# Patient Record
Sex: Female | Born: 1965 | Race: Black or African American | Hispanic: No | Smoking: Current every day smoker
Health system: Southern US, Community
[De-identification: ages and names within clinical notes are randomized; demographics above are authoritative.]

## PROBLEM LIST (undated history)

## (undated) ENCOUNTER — Emergency Department (HOSPITAL_COMMUNITY): Discharge status: Absent without leave | Payer: Medicaid Other

## (undated) DIAGNOSIS — M1990 Unspecified osteoarthritis, unspecified site: Secondary | ICD-10-CM

## (undated) DIAGNOSIS — F319 Bipolar disorder, unspecified: Secondary | ICD-10-CM

## (undated) DIAGNOSIS — N6320 Unspecified lump in the left breast, unspecified quadrant: Secondary | ICD-10-CM

## (undated) DIAGNOSIS — Z972 Presence of dental prosthetic device (complete) (partial): Secondary | ICD-10-CM

## (undated) DIAGNOSIS — F32A Depression, unspecified: Secondary | ICD-10-CM

## (undated) DIAGNOSIS — I10 Essential (primary) hypertension: Secondary | ICD-10-CM

## (undated) DIAGNOSIS — J45909 Unspecified asthma, uncomplicated: Secondary | ICD-10-CM

## (undated) DIAGNOSIS — K219 Gastro-esophageal reflux disease without esophagitis: Secondary | ICD-10-CM

## (undated) DIAGNOSIS — F329 Major depressive disorder, single episode, unspecified: Secondary | ICD-10-CM

## (undated) HISTORY — DX: Major depressive disorder, single episode, unspecified: F32.9

## (undated) HISTORY — DX: Depression, unspecified: F32.A

## (undated) HISTORY — PX: KNEE SURGERY: SHX244

## (undated) HISTORY — DX: Essential (primary) hypertension: I10

## (undated) HISTORY — PX: BREAST EXCISIONAL BIOPSY: SUR124

## (undated) HISTORY — PX: KNEE ARTHROSCOPY: SHX127

---

## 1999-11-25 ENCOUNTER — Emergency Department (HOSPITAL_COMMUNITY): Admission: EM | Admit: 1999-11-25 | Discharge: 1999-11-25 | Payer: Self-pay | Admitting: Emergency Medicine

## 2000-09-25 ENCOUNTER — Emergency Department (HOSPITAL_COMMUNITY): Admission: EM | Admit: 2000-09-25 | Discharge: 2000-09-25 | Payer: Self-pay | Admitting: Emergency Medicine

## 2001-06-15 ENCOUNTER — Emergency Department (HOSPITAL_COMMUNITY): Admission: EM | Admit: 2001-06-15 | Discharge: 2001-06-15 | Payer: Self-pay | Admitting: Emergency Medicine

## 2002-03-01 ENCOUNTER — Emergency Department (HOSPITAL_COMMUNITY): Admission: EM | Admit: 2002-03-01 | Discharge: 2002-03-01 | Payer: Self-pay | Admitting: Emergency Medicine

## 2003-08-16 ENCOUNTER — Emergency Department (HOSPITAL_COMMUNITY): Admission: EM | Admit: 2003-08-16 | Discharge: 2003-08-16 | Payer: Self-pay | Admitting: Emergency Medicine

## 2003-08-31 ENCOUNTER — Emergency Department (HOSPITAL_COMMUNITY): Admission: EM | Admit: 2003-08-31 | Discharge: 2003-08-31 | Payer: Self-pay

## 2003-09-13 ENCOUNTER — Ambulatory Visit: Payer: Self-pay | Admitting: Nurse Practitioner

## 2003-09-14 ENCOUNTER — Ambulatory Visit: Payer: Self-pay | Admitting: Internal Medicine

## 2003-09-18 ENCOUNTER — Ambulatory Visit: Payer: Self-pay | Admitting: Nurse Practitioner

## 2003-09-19 ENCOUNTER — Emergency Department (HOSPITAL_COMMUNITY): Admission: EM | Admit: 2003-09-19 | Discharge: 2003-09-19 | Payer: Self-pay

## 2003-10-15 ENCOUNTER — Ambulatory Visit: Payer: Self-pay | Admitting: *Deleted

## 2003-12-24 ENCOUNTER — Emergency Department (HOSPITAL_COMMUNITY): Admission: EM | Admit: 2003-12-24 | Discharge: 2003-12-24 | Payer: Self-pay | Admitting: Emergency Medicine

## 2006-07-23 ENCOUNTER — Ambulatory Visit: Payer: Self-pay | Admitting: Internal Medicine

## 2006-07-26 ENCOUNTER — Ambulatory Visit (HOSPITAL_COMMUNITY): Admission: RE | Admit: 2006-07-26 | Discharge: 2006-07-26 | Payer: Self-pay | Admitting: Family Medicine

## 2006-09-29 ENCOUNTER — Encounter (INDEPENDENT_AMBULATORY_CARE_PROVIDER_SITE_OTHER): Payer: Self-pay | Admitting: *Deleted

## 2006-12-29 ENCOUNTER — Emergency Department (HOSPITAL_COMMUNITY): Admission: EM | Admit: 2006-12-29 | Discharge: 2006-12-29 | Payer: Self-pay | Admitting: Emergency Medicine

## 2007-03-21 ENCOUNTER — Emergency Department (HOSPITAL_COMMUNITY): Admission: EM | Admit: 2007-03-21 | Discharge: 2007-03-21 | Payer: Self-pay | Admitting: Emergency Medicine

## 2007-05-18 ENCOUNTER — Ambulatory Visit: Payer: Self-pay | Admitting: Internal Medicine

## 2007-05-18 ENCOUNTER — Encounter (INDEPENDENT_AMBULATORY_CARE_PROVIDER_SITE_OTHER): Payer: Self-pay | Admitting: Nurse Practitioner

## 2007-05-18 LAB — CONVERTED CEMR LAB
ALT: 20 units/L (ref 0–35)
AST: 21 units/L (ref 0–37)
Albumin: 4.2 g/dL (ref 3.5–5.2)
Alkaline Phosphatase: 62 units/L (ref 39–117)
BUN: 8 mg/dL (ref 6–23)
Basophils Absolute: 0 10*3/uL (ref 0.0–0.1)
Basophils Relative: 0 % (ref 0–1)
CO2: 24 meq/L (ref 19–32)
Calcium: 9.1 mg/dL (ref 8.4–10.5)
Chloride: 105 meq/L (ref 96–112)
Creatinine, Ser: 0.67 mg/dL (ref 0.40–1.20)
Eosinophils Absolute: 0.2 10*3/uL (ref 0.0–0.7)
Eosinophils Relative: 3 % (ref 0–5)
Glucose, Bld: 106 mg/dL — ABNORMAL HIGH (ref 70–99)
HCT: 44.2 % (ref 36.0–46.0)
Hemoglobin: 13.9 g/dL (ref 12.0–15.0)
Lymphocytes Relative: 41 % (ref 12–46)
Lymphs Abs: 3.6 10*3/uL (ref 0.7–4.0)
MCHC: 31.4 g/dL (ref 30.0–36.0)
MCV: 82 fL (ref 78.0–100.0)
Monocytes Absolute: 0.5 10*3/uL (ref 0.1–1.0)
Monocytes Relative: 6 % (ref 3–12)
Neutro Abs: 4.4 10*3/uL (ref 1.7–7.7)
Neutrophils Relative %: 50 % (ref 43–77)
Platelets: 289 10*3/uL (ref 150–400)
Potassium: 4.2 meq/L (ref 3.5–5.3)
RBC: 5.39 M/uL — ABNORMAL HIGH (ref 3.87–5.11)
RDW: 16.7 % — ABNORMAL HIGH (ref 11.5–15.5)
Sodium: 140 meq/L (ref 135–145)
TSH: 1.06 microintl units/mL (ref 0.350–5.50)
Total Bilirubin: 0.4 mg/dL (ref 0.3–1.2)
Total Protein: 7.1 g/dL (ref 6.0–8.3)
WBC: 8.8 10*3/uL (ref 4.0–10.5)

## 2008-07-12 ENCOUNTER — Ambulatory Visit: Payer: Self-pay | Admitting: Internal Medicine

## 2008-08-22 ENCOUNTER — Ambulatory Visit: Payer: Self-pay | Admitting: Family Medicine

## 2008-08-22 ENCOUNTER — Encounter (INDEPENDENT_AMBULATORY_CARE_PROVIDER_SITE_OTHER): Payer: Self-pay | Admitting: Internal Medicine

## 2008-08-22 LAB — CONVERTED CEMR LAB
ALT: <8 units/L (ref 0–35)
AST: 8 units/L (ref 0–37)
Albumin: 4.4 g/dL (ref 3.5–5.2)
Alkaline Phosphatase: 53 units/L (ref 39–117)
BUN: 12 mg/dL (ref 6–23)
Basophils Absolute: 0 10*3/uL (ref 0.0–0.1)
Basophils Relative: 0 % (ref 0–1)
CO2: 22 meq/L (ref 19–32)
Calcium: 9.3 mg/dL (ref 8.4–10.5)
Chlamydia, Swab/Urine, PCR: NEGATIVE
Chloride: 108 meq/L (ref 96–112)
Creatinine, Ser: 0.68 mg/dL (ref 0.40–1.20)
Eosinophils Absolute: 0.1 10*3/uL (ref 0.0–0.7)
Eosinophils Relative: 1 % (ref 0–5)
GC Probe Amp, Urine: NEGATIVE
Glucose, Bld: 97 mg/dL (ref 70–99)
HCT: 39.7 % (ref 36.0–46.0)
Hemoglobin: 12.8 g/dL (ref 12.0–15.0)
Lymphocytes Relative: 35 % (ref 12–46)
Lymphs Abs: 3.2 10*3/uL (ref 0.7–4.0)
MCHC: 32.2 g/dL (ref 30.0–36.0)
MCV: 82.7 fL (ref 78.0–100.0)
Monocytes Absolute: 0.5 10*3/uL (ref 0.1–1.0)
Monocytes Relative: 5 % (ref 3–12)
Neutro Abs: 5.4 10*3/uL (ref 1.7–7.7)
Neutrophils Relative %: 58 % (ref 43–77)
Platelets: 258 10*3/uL (ref 150–400)
Potassium: 4.1 meq/L (ref 3.5–5.3)
RBC: 4.8 M/uL (ref 3.87–5.11)
RDW: 15.1 % (ref 11.5–15.5)
Sodium: 142 meq/L (ref 135–145)
TSH: 1.479 microintl units/mL (ref 0.350–4.500)
Total Bilirubin: 0.2 mg/dL — ABNORMAL LOW (ref 0.3–1.2)
Total Protein: 7.1 g/dL (ref 6.0–8.3)
WBC: 9.2 10*3/uL (ref 4.0–10.5)

## 2009-07-08 ENCOUNTER — Emergency Department (HOSPITAL_COMMUNITY): Admission: EM | Admit: 2009-07-08 | Discharge: 2009-07-08 | Payer: Self-pay | Admitting: Emergency Medicine

## 2009-07-08 ENCOUNTER — Ambulatory Visit (HOSPITAL_COMMUNITY): Admission: RE | Admit: 2009-07-08 | Discharge: 2009-07-08 | Payer: Self-pay | Admitting: Psychiatry

## 2009-07-25 ENCOUNTER — Ambulatory Visit: Payer: Self-pay | Admitting: Internal Medicine

## 2009-08-06 ENCOUNTER — Emergency Department (HOSPITAL_COMMUNITY): Admission: EM | Admit: 2009-08-06 | Discharge: 2009-08-06 | Payer: Self-pay | Admitting: Family Medicine

## 2009-11-14 ENCOUNTER — Emergency Department (HOSPITAL_COMMUNITY): Admission: EM | Admit: 2009-11-14 | Discharge: 2009-11-14 | Payer: Self-pay | Admitting: Emergency Medicine

## 2009-12-21 ENCOUNTER — Emergency Department (HOSPITAL_COMMUNITY)
Admission: EM | Admit: 2009-12-21 | Discharge: 2009-12-21 | Payer: Self-pay | Source: Home / Self Care | Admitting: Family Medicine

## 2009-12-31 ENCOUNTER — Emergency Department (HOSPITAL_COMMUNITY)
Admission: EM | Admit: 2009-12-31 | Discharge: 2009-12-31 | Payer: Self-pay | Source: Home / Self Care | Admitting: Emergency Medicine

## 2010-02-02 ENCOUNTER — Encounter: Payer: Self-pay | Admitting: Family Medicine

## 2010-02-03 ENCOUNTER — Encounter: Payer: Self-pay | Admitting: Internal Medicine

## 2010-02-06 ENCOUNTER — Other Ambulatory Visit: Payer: Self-pay | Admitting: Internal Medicine

## 2010-02-06 DIAGNOSIS — Z1239 Encounter for other screening for malignant neoplasm of breast: Secondary | ICD-10-CM

## 2010-02-13 ENCOUNTER — Ambulatory Visit: Payer: Self-pay

## 2010-02-18 ENCOUNTER — Other Ambulatory Visit: Payer: Self-pay | Admitting: Internal Medicine

## 2010-02-18 DIAGNOSIS — Z1231 Encounter for screening mammogram for malignant neoplasm of breast: Secondary | ICD-10-CM

## 2010-02-26 ENCOUNTER — Ambulatory Visit: Payer: Self-pay | Admitting: Obstetrics & Gynecology

## 2010-02-27 ENCOUNTER — Ambulatory Visit: Payer: Self-pay

## 2010-03-03 ENCOUNTER — Ambulatory Visit: Payer: Self-pay

## 2010-03-05 ENCOUNTER — Other Ambulatory Visit: Payer: Self-pay | Admitting: Obstetrics and Gynecology

## 2010-03-05 ENCOUNTER — Ambulatory Visit (INDEPENDENT_AMBULATORY_CARE_PROVIDER_SITE_OTHER): Payer: Medicaid Other | Admitting: Obstetrics and Gynecology

## 2010-03-05 DIAGNOSIS — N946 Dysmenorrhea, unspecified: Secondary | ICD-10-CM

## 2010-03-05 DIAGNOSIS — D219 Benign neoplasm of connective and other soft tissue, unspecified: Secondary | ICD-10-CM

## 2010-03-11 ENCOUNTER — Ambulatory Visit (HOSPITAL_COMMUNITY)
Admission: RE | Admit: 2010-03-11 | Discharge: 2010-03-11 | Disposition: A | Discharge status: Home / Self Care | Payer: Medicaid Other | Source: Ambulatory Visit | Attending: Obstetrics and Gynecology | Admitting: Obstetrics and Gynecology

## 2010-03-11 DIAGNOSIS — D259 Leiomyoma of uterus, unspecified: Secondary | ICD-10-CM | POA: Insufficient documentation

## 2010-03-11 DIAGNOSIS — N949 Unspecified condition associated with female genital organs and menstrual cycle: Secondary | ICD-10-CM | POA: Insufficient documentation

## 2010-03-11 DIAGNOSIS — D219 Benign neoplasm of connective and other soft tissue, unspecified: Secondary | ICD-10-CM

## 2010-03-19 ENCOUNTER — Other Ambulatory Visit: Payer: Self-pay | Admitting: Obstetrics and Gynecology

## 2010-03-19 ENCOUNTER — Ambulatory Visit (INDEPENDENT_AMBULATORY_CARE_PROVIDER_SITE_OTHER): Payer: Medicaid Other | Admitting: Obstetrics and Gynecology

## 2010-03-19 DIAGNOSIS — N949 Unspecified condition associated with female genital organs and menstrual cycle: Secondary | ICD-10-CM

## 2010-03-21 ENCOUNTER — Ambulatory Visit: Payer: Medicaid Other

## 2010-03-30 LAB — CBC
HCT: 42.3 % (ref 36.0–46.0)
Hemoglobin: 13.9 g/dL (ref 12.0–15.0)
MCH: 27.8 pg (ref 26.0–34.0)
MCHC: 33 g/dL (ref 30.0–36.0)
MCV: 84.3 fL (ref 78.0–100.0)
Platelets: 252 10*3/uL (ref 150–400)
RBC: 5.01 MIL/uL (ref 3.87–5.11)
RDW: 14.9 % (ref 11.5–15.5)
WBC: 8.4 10*3/uL (ref 4.0–10.5)

## 2010-03-30 LAB — DIFFERENTIAL
Basophils Absolute: 0 10*3/uL (ref 0.0–0.1)
Basophils Relative: 0 % (ref 0–1)
Eosinophils Absolute: 0.1 10*3/uL (ref 0.0–0.7)
Eosinophils Relative: 1 % (ref 0–5)
Lymphocytes Relative: 26 % (ref 12–46)
Lymphs Abs: 2.1 10*3/uL (ref 0.7–4.0)
Monocytes Absolute: 0.5 10*3/uL (ref 0.1–1.0)
Monocytes Relative: 6 % (ref 3–12)
Neutro Abs: 5.6 10*3/uL (ref 1.7–7.7)
Neutrophils Relative %: 67 % (ref 43–77)

## 2010-03-30 LAB — BASIC METABOLIC PANEL
BUN: 4 mg/dL — ABNORMAL LOW (ref 6–23)
CO2: 23 mEq/L (ref 19–32)
Calcium: 8.7 mg/dL (ref 8.4–10.5)
Chloride: 105 mEq/L (ref 96–112)
Creatinine, Ser: 0.75 mg/dL (ref 0.4–1.2)
GFR calc Af Amer: >60 mL/min (ref >60)
GFR calc non Af Amer: >60 mL/min (ref >60)
Glucose, Bld: 96 mg/dL (ref 70–99)
Potassium: 3.6 mEq/L (ref 3.5–5.1)
Sodium: 137 mEq/L (ref 135–145)

## 2010-03-30 LAB — PREGNANCY, URINE: Preg Test, Ur: NEGATIVE

## 2010-03-30 LAB — RAPID URINE DRUG SCREEN, HOSP PERFORMED
Amphetamines: NOT DETECTED
Barbiturates: NOT DETECTED
Benzodiazepines: NOT DETECTED
Cocaine: POSITIVE — AB
Opiates: NOT DETECTED
Tetrahydrocannabinol: NOT DETECTED

## 2010-03-30 LAB — TRICYCLICS SCREEN, URINE: TCA Scrn: NOT DETECTED

## 2010-03-30 LAB — ETHANOL: Alcohol, Ethyl (B): <5 mg/dL (ref 0–10)

## 2010-04-01 ENCOUNTER — Ambulatory Visit: Payer: Medicaid Other

## 2010-04-04 NOTE — Progress Notes (Signed)
NAME:  Monique Fowler, MILES NO.:  [id number]  MEDICAL RECORD NO.:  [id number]           PATIENT TYPE:  A  LOCATION:  [facility]                   FACILITY:  [facility]  PHYSICIAN:  Tinnie Gens, MD        DATE OF BIRTH:  September 26, 1965  DATE OF SERVICE:  03/05/2010                                 CLINIC NOTE  REASON FOR VISIT:  Dysmenorrhea.  HISTORY OF PRESENT ILLNESS:  Monique Fowler is a 45 year old G7, P7 whose last pregnancy was approximately 11 years ago coming in after being referred by [facility] for possible dysmenorrhea and possible need for endometrial biopsy.  The patient states that over the last 3 months, she has been having pain 3-4 days prior to her menstruation usually in the left side that radiates down into her pelvis, around her back, and even down her legs from-to-time.  The patient denies any type of increased bleeding or increased length in her menstruation at this time or increase in clot formation.  The patient denies any type of fevers, chills, nausea, vomiting, diarrhea, constipation, or weight changes on review of symptoms.  The patient for this pain has tried different things at this clinic, they had given her Vicodin, which is the only thing that seems to have helped the pain at this time.  PAST MEDICAL HISTORY:  The patient has a history of gastroesophageal reflux disease, bipolar disease, anxiety, hypertension, and insomnia.  PAST SURGICAL HISTORY:  The patient has had a C-section x1 as well as bilateral tubal ligation.  OB HISTORY:  The patient has been pregnant 7 times and has birth 7 children, 6 of them remain living.  SOCIAL HISTORY:  Smokes a quarter pack of cigarettes per day.  No alcohol.  No illicit.  FAMILY HISTORY:  There is liver cancer from her father, otherwise, unremarkable.  PHYSICAL EXAMINATION:  VITAL SIGNS:  Temperature 97.4, pulse 82, blood pressure 104/73, weight 216 pounds, and height 5 feet 7. GENERAL:  No  apparent distress.  Alert and oriented x3. HEART:  Regular rate and rhythm.  No murmur. LUNGS:  Clear to auscultation bilaterally.  Mild expiratory wheeze in the lower lobes bilaterally. ABDOMEN:  Bowel sounds positive.  The patient is tender to palpation in the left lower quadrant. PELVIC:  Normal external female genitalia.  Vaginal vault pink and moist, healthy looking, some mild yeast in the vaginal vault.  Cervix seen.  Pap taken.  Bimanual exam, the patient is tender on the left lower quadrant.  Uterus is mildly enlarged with approximately 12 weeks' gestation and possible fibroid palpated in the left lower quadrant. EXTREMITIES:  Trace edema.  ASSESSMENT AND PLAN:  Monique Fowler is a 45 year old female who is coming in with dysmenorrhea likely secondary to possible fibroid. 1. The patient likely has fibroid and dysmenorrhea.  The     patient will be scheduled for an ultrasound and then the patient is     to return after the ultrasound in approximately 2 weeks to go over     the results and possible treatment at that time.  The patient was     told about  the different types of oral contraceptives or other     types of contraceptives as well, then might be a good option for     the patient to decrease the frequency of her menstruation, which     would help with the pain.  The patient also told about possible     NSAID use for the pain instead of Vicodin which would help.  The     patient states it does upset her stomach, but she might give it a     try, has no history of any type of stomach ulcer. 2. Preventative care.  The patient did have the Pap smear done today.     The patient states has not had one for approximately 13 years.  We     will contact her if anything is abnormal.  The patient is already     scheduled for mammogram at this time.          ______________________________ Tinnie Gens, MD    TP/MEDQ  D:  03/05/2010  T:  03/06/2010  Job:  [id number]

## 2010-04-04 NOTE — Progress Notes (Unsigned)
NAME:  Monique Fowler, Monique Fowler NO.:  [id number]  MEDICAL RECORD NO.:  [id number]           PATIENT TYPE:  A  LOCATION:  [facility]                   FACILITY:  [facility]  PHYSICIAN:  Argentina Donovan, MD        DATE OF BIRTH:  10/13/1965  DATE OF SERVICE:  03/19/2010                                 CLINIC NOTE  The patient is a 45 year old gravida 7, para 7, whose last pregnancy was 11 years ago by cesarean section.  Since that time, she has had increasing dysmenorrhea, but the pain basically is starting as much as 2 weeks before her period and does not go away until after the period is over.  Periods have also gotten progressively heavier, although she has no bleeding in between.  She came in today.  She was seen a short time ago and got an ultrasound which showed a small fibroid on the uterus. Otherwise, the uterus looked fine.  We repeated the Pap smear today because there were no endocervical cells on the Pap on the previous one. She had been treated for Trichomonas, but did not take the medicine more than 1 day because it was making her sick.  I told her to take it with meals.  I gave her another prescription today.  What we are going to do is try her on Depo-Provera to see whether that is stopping her periods and if that stops the pain.  If not, I think that the only solution to this lady's discomfort which is becoming intolerable is a hysterectomy feeling that she has adenomyosis and in general I have not found the Depo-Provera was of much help.  IMPRESSION: 1. Inadequate Pap smear, repeated. 2. Trichomonal vaginitis. 3. Adenomyosis. 4. Dysmenorrhea. 5. Menorrhagia.          ______________________________ Argentina Donovan, MD    PR/MEDQ  D:  03/19/2010  T:  03/20/2010  Job:  [id number]

## 2010-05-12 ENCOUNTER — Ambulatory Visit (INDEPENDENT_AMBULATORY_CARE_PROVIDER_SITE_OTHER): Payer: Medicaid Other | Admitting: Obstetrics & Gynecology

## 2010-05-12 DIAGNOSIS — E669 Obesity, unspecified: Secondary | ICD-10-CM

## 2010-05-12 DIAGNOSIS — Z01818 Encounter for other preprocedural examination: Secondary | ICD-10-CM

## 2010-05-13 NOTE — Group Therapy Note (Signed)
NAME:  Monique Fowler, Monique Fowler NO.:  [id number]  MEDICAL RECORD NO.:  [id number]           PATIENT TYPE:  A  LOCATION:  [facility]                   FACILITY:  [facility]  PHYSICIAN:  Allie Bossier, MD        DATE OF BIRTH:  17-Aug-1965  DATE OF SERVICE:  05/12/2010                                 CLINIC NOTE  Ms. Seifer is a 45 year old single black P6, A1 who was seen here by Dr. Okey Dupre on March 19, 2010, with a complaint of increasing dysmenorrhea, that starts as much as 2 weeks before her period and then does not go away until her period is over.  She says that ibuprofen does not help and she has tried [facility] powders and they are getting not be helpful either.  Of note, she was given a prescription for Trichomonas in February 2012, however, she says she threw it and did not keep the medicine, so Dr. Okey Dupre gave her another prescription in March 2012.  She says she was able to keep it down but her boyfriend did not get it.  I have explained at length that should she have surgery (hysterectomy) and that she has been untreated or has gotten Trichomonas back from her boyfriend again, then she could certainly develop a wound/pelvic infection that could be very serious.  Workup Dr. Okey Dupre did include an ultrasound which showed a 3-cm fundal intramural fibroid.  Her uterus measures 8.8 x 4 x 4.5 cm and the ovaries appeared normal.  Pap smear was normal and I do not see cervical cultures done.  REVIEW OF SYSTEMS:  She tells me that she does report dyspareunia but is able to have an orgasm.  She has been monogamous for less than a year (August 2011).  She has never had a mammogram.  PREVIOUS SURGERIES:  Tubal ligation and C-section.  MEDICATIONS: 1. Nexium daily. 2. Depakote 500 mg t.i.d. 3. Xanax 1 mg p.r.n. 4. Abilify 5 mg daily. 5. Trazodone 100 mg at bedtime. 6. BC powders p.r.n. 7. Lipitor 20 mg daily. 8. Multivitamin. 9. Lisinopril 20 mg daily.  MEDICAL PROBLEMS:  As above plus  obesity, fibroid, smoker, reflux, depression/anxiety, elevated lipids.  SOCIAL HISTORY:  She smokes fourth pack a day for about 30 years.  She denies alcohol or drug use.  No known drug allergies.  No latex allergies.  FAMILY HISTORY:  Negative for breast, GYN, and colon malignancies.  PHYSICAL EXAMINATION:  GENERAL:  Well-nourished, well-hydrated pleasant black female. VITAL SIGNS:  Height 5 feet 7-1/2 inches, weight 213 pounds, blood pressure 126/85, pulse 101.  She is afebrile. HEENT:  Normal heart regular rate rhythm. LUNGS:  Clear to auscultation bilaterally. ABDOMEN:  Obese, benign.  There is a C-section scar noted. GU:  Cervix appears normal with speculum exam.  There is no descensus. She has reasonably large pelvis, delivery of her uterus.  Uterus is 6- week size, anteverted mobile.  She appears to have some tenderness with palpation of the ovaries and the uterus.  ASSESSMENT AND PLAN:  Chronic pelvic pain plus dysmenorrhea.  Dr. Okey Dupre gave her a Depo-Provera injection and she says this  stopped her period but not her pain.  She would like to pursue surgical management.  Dr. Okey Dupre recommended a hysterectomy.  I would suggest that she also have her ovaries removed since her pain is not limited to the time she has her period, but 2 weeks prior to her period as well.  I suspect that endometriosis as equally is likely as adenomyosis and pain with her fibroids.  I have told her that she will probably want estrogen therapy postoperatively.  I have recommended that we attempt to remove her uterus with robotic assistance, do laparoscopy to facilitate early recovery.  I have been very adamant and telling her she cannot have intercourse until 6 weeks postoperatively.  Risks of surgery including but not limited to infection, bleeding, damage to bowel, bladder, and ureters have been discussed, understood and accepted and I will plan on doing a robotic laparoscopic hysterectomy and  bilateral salpingo- oophorectomy.     Allie Bossier, MD    MCD/MEDQ  D:  05/12/2010  T:  05/13/2010  Job:  [id number]

## 2010-05-26 ENCOUNTER — Emergency Department (HOSPITAL_COMMUNITY)
Admission: EM | Admit: 2010-05-26 | Discharge: 2010-05-26 | Disposition: A | Discharge status: Home / Self Care | Payer: Medicaid Other | Attending: Emergency Medicine | Admitting: Emergency Medicine

## 2010-05-26 DIAGNOSIS — R109 Unspecified abdominal pain: Secondary | ICD-10-CM | POA: Insufficient documentation

## 2010-06-02 ENCOUNTER — Emergency Department (HOSPITAL_COMMUNITY)
Admission: EM | Admit: 2010-06-02 | Discharge: 2010-06-02 | Disposition: A | Discharge status: Home / Self Care | Payer: Medicaid Other | Attending: Emergency Medicine | Admitting: Emergency Medicine

## 2010-06-02 ENCOUNTER — Emergency Department (HOSPITAL_COMMUNITY): Payer: Medicaid Other

## 2010-06-02 DIAGNOSIS — J45909 Unspecified asthma, uncomplicated: Secondary | ICD-10-CM | POA: Insufficient documentation

## 2010-06-02 DIAGNOSIS — I10 Essential (primary) hypertension: Secondary | ICD-10-CM | POA: Insufficient documentation

## 2010-06-02 DIAGNOSIS — R059 Cough, unspecified: Secondary | ICD-10-CM | POA: Insufficient documentation

## 2010-06-02 DIAGNOSIS — R05 Cough: Secondary | ICD-10-CM | POA: Insufficient documentation

## 2010-06-18 ENCOUNTER — Ambulatory Visit: Payer: Medicaid Other

## 2010-06-30 ENCOUNTER — Other Ambulatory Visit: Payer: Self-pay | Admitting: Obstetrics & Gynecology

## 2010-06-30 ENCOUNTER — Ambulatory Visit (INDEPENDENT_AMBULATORY_CARE_PROVIDER_SITE_OTHER): Payer: Medicaid Other

## 2010-06-30 DIAGNOSIS — N946 Dysmenorrhea, unspecified: Secondary | ICD-10-CM

## 2010-06-30 DIAGNOSIS — D259 Leiomyoma of uterus, unspecified: Secondary | ICD-10-CM

## 2010-06-30 LAB — POCT PREGNANCY, URINE: Preg Test, Ur: NEGATIVE

## 2010-07-07 ENCOUNTER — Ambulatory Visit (HOSPITAL_COMMUNITY)
Admission: RE | Admit: 2010-07-07 | Payer: Medicaid Other | Source: Ambulatory Visit | Admitting: Obstetrics & Gynecology

## 2010-07-08 ENCOUNTER — Emergency Department (HOSPITAL_COMMUNITY)
Admission: EM | Admit: 2010-07-08 | Discharge: 2010-07-08 | Disposition: A | Discharge status: Home / Self Care | Payer: Medicaid Other | Attending: Emergency Medicine | Admitting: Emergency Medicine

## 2010-07-08 DIAGNOSIS — R059 Cough, unspecified: Secondary | ICD-10-CM | POA: Insufficient documentation

## 2010-07-08 DIAGNOSIS — Z79899 Other long term (current) drug therapy: Secondary | ICD-10-CM | POA: Insufficient documentation

## 2010-07-08 DIAGNOSIS — F17200 Nicotine dependence, unspecified, uncomplicated: Secondary | ICD-10-CM | POA: Insufficient documentation

## 2010-07-08 DIAGNOSIS — R05 Cough: Secondary | ICD-10-CM | POA: Insufficient documentation

## 2010-07-08 DIAGNOSIS — J45909 Unspecified asthma, uncomplicated: Secondary | ICD-10-CM | POA: Insufficient documentation

## 2010-07-08 DIAGNOSIS — M25569 Pain in unspecified knee: Secondary | ICD-10-CM | POA: Insufficient documentation

## 2010-07-08 DIAGNOSIS — I10 Essential (primary) hypertension: Secondary | ICD-10-CM | POA: Insufficient documentation

## 2010-07-08 DIAGNOSIS — M25469 Effusion, unspecified knee: Secondary | ICD-10-CM | POA: Insufficient documentation

## 2010-08-12 ENCOUNTER — Encounter: Payer: Self-pay | Admitting: *Deleted

## 2010-08-12 DIAGNOSIS — F329 Major depressive disorder, single episode, unspecified: Secondary | ICD-10-CM | POA: Insufficient documentation

## 2010-08-12 DIAGNOSIS — N920 Excessive and frequent menstruation with regular cycle: Secondary | ICD-10-CM

## 2010-08-12 DIAGNOSIS — F17200 Nicotine dependence, unspecified, uncomplicated: Secondary | ICD-10-CM | POA: Insufficient documentation

## 2010-08-12 DIAGNOSIS — K219 Gastro-esophageal reflux disease without esophagitis: Secondary | ICD-10-CM | POA: Insufficient documentation

## 2010-08-12 DIAGNOSIS — D219 Benign neoplasm of connective and other soft tissue, unspecified: Secondary | ICD-10-CM | POA: Insufficient documentation

## 2010-08-12 DIAGNOSIS — N946 Dysmenorrhea, unspecified: Secondary | ICD-10-CM | POA: Insufficient documentation

## 2010-09-10 ENCOUNTER — Ambulatory Visit: Payer: Medicaid Other | Admitting: Obstetrics & Gynecology

## 2010-09-22 ENCOUNTER — Ambulatory Visit: Payer: Medicaid Other

## 2010-10-06 LAB — INFLUENZA A+B VIRUS AG-DIRECT(RAPID): Influenza B Ag: NEGATIVE

## 2010-12-06 ENCOUNTER — Emergency Department (HOSPITAL_COMMUNITY): Payer: Medicaid Other

## 2010-12-06 ENCOUNTER — Emergency Department (HOSPITAL_COMMUNITY)
Admit: 2010-12-06 | Discharge: 2010-12-07 | Discharge status: Against Medical Advice | Payer: Medicaid Other | Attending: Emergency Medicine | Admitting: Emergency Medicine

## 2010-12-06 DIAGNOSIS — I10 Essential (primary) hypertension: Secondary | ICD-10-CM | POA: Insufficient documentation

## 2010-12-06 DIAGNOSIS — M2390 Unspecified internal derangement of unspecified knee: Secondary | ICD-10-CM | POA: Insufficient documentation

## 2010-12-06 DIAGNOSIS — W010XXA Fall on same level from slipping, tripping and stumbling without subsequent striking against object, initial encounter: Secondary | ICD-10-CM | POA: Insufficient documentation

## 2010-12-06 DIAGNOSIS — M25569 Pain in unspecified knee: Secondary | ICD-10-CM | POA: Insufficient documentation

## 2010-12-06 MED ORDER — HYDROCODONE-ACETAMINOPHEN 5-500 MG PO TABS: 1.0000 | ORAL_TABLET | Freq: Four times a day (QID) | ORAL | Status: DC | PRN

## 2010-12-06 MED ORDER — IBUPROFEN 800 MG PO TABS
800.0000 mg | ORAL_TABLET | Freq: Once | ORAL | Status: DC
  Filled 2010-12-06: qty 1

## 2010-12-06 NOTE — ED Provider Notes (Signed)
History     CSN: [id number] Arrival date & time: 12/06/2010 10:20 PM   First MD Initiated Contact with Patient 12/06/10 2222      Chief Complaint  Patient presents with  . Knee Pain    (Consider location/radiation/quality/duration/timing/severity/associated sxs/prior treatment) HPI Comments: Patient reports hurting into store yesterday for black Friday sales tripping over a wire landing on her right knee.  She's been painful and swollen since she has basically been lying in the bed.  All day tonight called EMS to transport for evaluation.  Denies previous injury to that knee  The history is provided by the patient.    Past Medical History  Diagnosis Date  . Depression   . Hypertension     Past Surgical History  Procedure Date  . Cesarean section     No family history on file.  History  Substance Use Topics  . Smoking status: Not on file  . Smokeless tobacco: Not on file  . Alcohol Use:     OB History    Grav Para Term Preterm Abortions TAB SAB Ect Mult Living                  Review of Systems  Constitutional: Negative.   HENT: Negative.   Eyes: Negative.   Respiratory: Negative.   Cardiovascular: Negative.   Gastrointestinal: Negative.   Genitourinary: Negative.   Musculoskeletal: Positive for gait problem. Negative for back pain and joint swelling.  Neurological: Negative.   Hematological: Negative.   Psychiatric/Behavioral: Negative.     Allergies  Review of patient's allergies indicates no known allergies.  Home Medications   Current Outpatient Rx  Name Route Sig Dispense Refill  . ALPRAZOLAM 1 MG PO TABS Oral Take 1 mg by mouth as needed.      . ARIPIPRAZOLE 5 MG PO TABS Oral Take 5 mg by mouth daily.      . ATORVASTATIN CALCIUM 20 MG PO TABS Oral Take 20 mg by mouth daily.      Marland Kitchen DIVALPROEX SODIUM 500 MG PO TBEC Oral Take 500 mg by mouth 3 (three) times daily.      Marland Kitchen ESOMEPRAZOLE MAGNESIUM 40 MG PO CPDR Oral Take 40 mg by mouth daily.        Marland Kitchen LISINOPRIL 20 MG PO TABS Oral Take 20 mg by mouth daily.      . TRAZODONE HCL 100 MG PO TABS Oral Take 100 mg by mouth at bedtime.      Marland Kitchen HYDROCODONE-ACETAMINOPHEN 5-500 MG PO TABS Oral Take 1-2 tablets by mouth every 6 (six) hours as needed for pain. 15 tablet 0    BP 132/81  Pulse 99  Temp(Src) 98.8 F (37.1 C) (Oral)  Resp 16  Wt 230 lb (104.327 kg)  SpO2 98%  Physical Exam  Constitutional: She is oriented to person, place, and time. She appears well-developed and well-nourished.  Eyes: EOM are normal.  Neck: Neck supple.  Cardiovascular: Normal rate.   Pulmonary/Chest: Breath sounds normal.  Musculoskeletal:       Right shoulder: She exhibits tenderness.       Right knee: She exhibits bony tenderness. She exhibits no ecchymosis, no deformity, no laceration and no erythema. tenderness found.  Neurological: She is oriented to person, place, and time.  Skin: Skin is dry.  Psychiatric: She has a normal mood and affect.    ED Course  Procedures (including critical care time)  Labs Reviewed - No data to display Dg Knee Complete 4 Views  Right  12/06/2010  *RADIOLOGY REPORT*  Clinical Data: Twist injury 1 day ago, fall, pain  RIGHT KNEE - COMPLETE 4+ VIEW  Comparison: 12/24/2003  Findings: Osseous demineralization. Joint spaces preserved. Slight cortical irregularity at lateral margin of the proximal tibia unchanged, question sequela of remote injury. No acute fracture, dislocation, or bone destruction. No definite knee joint effusion.  IMPRESSION: No acute bony abnormalities.  Original Report Authenticated By: Lollie Marrow, M.D.     1. Knee internal derangement       MDM  Fracture Knee sprain         Arman Filter, NP 12/06/10 2313  Arman Filter, NP 12/06/10 2316  Arman Filter, NP 12/06/10 2321

## 2010-12-06 NOTE — ED Notes (Signed)
Pt with c/o right knee pain. Pt states she tripped yesterday going into a store and fell twisting her right knee. Pt states it hurts to bare weight, but she is ambulatory. Pt states she hears "crunching" when she bends her knee. Minimal swelling noted to right knee.

## 2010-12-06 NOTE — ED Notes (Signed)
EDP at bedside  
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
## 2010-12-06 NOTE — ED Notes (Signed)
As per EMS, pt states she tripped over wires walking to store. States she twisted knee. Some swelling to rt knee. Pt c/o limited range of motion

## 2010-12-07 DIAGNOSIS — F329 Major depressive disorder, single episode, unspecified: Secondary | ICD-10-CM | POA: Insufficient documentation

## 2010-12-07 DIAGNOSIS — Z79899 Other long term (current) drug therapy: Secondary | ICD-10-CM | POA: Insufficient documentation

## 2010-12-07 DIAGNOSIS — F3289 Other specified depressive episodes: Secondary | ICD-10-CM | POA: Insufficient documentation

## 2010-12-07 DIAGNOSIS — M25569 Pain in unspecified knee: Secondary | ICD-10-CM | POA: Insufficient documentation

## 2010-12-07 DIAGNOSIS — IMO0002 Reserved for concepts with insufficient information to code with codable children: Secondary | ICD-10-CM | POA: Insufficient documentation

## 2010-12-07 DIAGNOSIS — W010XXA Fall on same level from slipping, tripping and stumbling without subsequent striking against object, initial encounter: Secondary | ICD-10-CM | POA: Insufficient documentation

## 2010-12-07 DIAGNOSIS — I10 Essential (primary) hypertension: Secondary | ICD-10-CM | POA: Insufficient documentation

## 2010-12-07 DIAGNOSIS — M25519 Pain in unspecified shoulder: Secondary | ICD-10-CM | POA: Insufficient documentation

## 2010-12-07 DIAGNOSIS — Y9229 Other specified public building as the place of occurrence of the external cause: Secondary | ICD-10-CM | POA: Insufficient documentation

## 2010-12-07 NOTE — ED Provider Notes (Signed)
Medical screening examination/treatment/procedure(s) were performed by non-physician practitioner and as supervising physician I was immediately available for consultation/collaboration.   Lyanne Co, MD 12/07/10 [phone]

## 2010-12-11 ENCOUNTER — Emergency Department (HOSPITAL_COMMUNITY)
Admission: EM | Admit: 2010-12-11 | Discharge: 2010-12-11 | Disposition: A | Discharge status: Home / Self Care | Payer: No Typology Code available for payment source

## 2010-12-12 ENCOUNTER — Emergency Department (HOSPITAL_COMMUNITY)
Admission: EM | Admit: 2010-12-12 | Discharge: 2010-12-12 | Disposition: A | Discharge status: Home / Self Care | Payer: Medicaid Other | Attending: Emergency Medicine | Admitting: Emergency Medicine

## 2010-12-12 ENCOUNTER — Emergency Department (HOSPITAL_COMMUNITY): Payer: Medicaid Other

## 2010-12-12 ENCOUNTER — Encounter (HOSPITAL_COMMUNITY): Payer: Self-pay | Admitting: *Deleted

## 2010-12-12 DIAGNOSIS — M25569 Pain in unspecified knee: Secondary | ICD-10-CM | POA: Insufficient documentation

## 2010-12-12 DIAGNOSIS — M542 Cervicalgia: Secondary | ICD-10-CM | POA: Insufficient documentation

## 2010-12-12 DIAGNOSIS — I10 Essential (primary) hypertension: Secondary | ICD-10-CM | POA: Insufficient documentation

## 2010-12-12 DIAGNOSIS — F329 Major depressive disorder, single episode, unspecified: Secondary | ICD-10-CM | POA: Insufficient documentation

## 2010-12-12 DIAGNOSIS — F3289 Other specified depressive episodes: Secondary | ICD-10-CM | POA: Insufficient documentation

## 2010-12-12 DIAGNOSIS — F17200 Nicotine dependence, unspecified, uncomplicated: Secondary | ICD-10-CM | POA: Insufficient documentation

## 2010-12-12 DIAGNOSIS — M545 Low back pain, unspecified: Secondary | ICD-10-CM | POA: Insufficient documentation

## 2010-12-12 MED ORDER — CYCLOBENZAPRINE HCL 10 MG PO TABS: 10.0000 mg | ORAL_TABLET | Freq: Two times a day (BID) | ORAL | Status: AC | PRN

## 2010-12-12 MED ORDER — OXYCODONE-ACETAMINOPHEN 5-325 MG PO TABS: 1.0000 | ORAL_TABLET | Freq: Four times a day (QID) | ORAL | Status: AC | PRN

## 2010-12-12 NOTE — ED Provider Notes (Signed)
Medical screening examination/treatment/procedure(s) were performed by non-physician practitioner and as supervising physician I was immediately available for consultation/collaboration.   Lendora Keys M Jarnell Cordaro, DO 12/12/10 2006 
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
## 2011-02-05 ENCOUNTER — Encounter (HOSPITAL_COMMUNITY): Payer: Self-pay | Admitting: Adult Health

## 2011-02-05 ENCOUNTER — Emergency Department (HOSPITAL_COMMUNITY)
Admission: EM | Admit: 2011-02-05 | Discharge: 2011-02-05 | Disposition: A | Discharge status: Home / Self Care | Payer: Medicaid Other | Attending: Emergency Medicine | Admitting: Emergency Medicine

## 2011-02-05 DIAGNOSIS — M25469 Effusion, unspecified knee: Secondary | ICD-10-CM | POA: Insufficient documentation

## 2011-02-05 DIAGNOSIS — I10 Essential (primary) hypertension: Secondary | ICD-10-CM | POA: Insufficient documentation

## 2011-02-05 DIAGNOSIS — F329 Major depressive disorder, single episode, unspecified: Secondary | ICD-10-CM | POA: Insufficient documentation

## 2011-02-05 DIAGNOSIS — M25569 Pain in unspecified knee: Secondary | ICD-10-CM | POA: Insufficient documentation

## 2011-02-05 DIAGNOSIS — F3289 Other specified depressive episodes: Secondary | ICD-10-CM | POA: Insufficient documentation

## 2011-02-05 MED ORDER — OXYCODONE-ACETAMINOPHEN 5-325 MG PO TABS: 1.0000 | ORAL_TABLET | Freq: Four times a day (QID) | ORAL | Status: AC | PRN
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
## 2011-02-06 NOTE — ED Provider Notes (Signed)
Medical screening examination/treatment/procedure(s) were performed by non-physician practitioner and as supervising physician I was immediately available for consultation/collaboration. Devoria Albe, MD, Armando Gang   Ward Givens, MD 02/06/11 [phone]

## 2011-02-26 ENCOUNTER — Ambulatory Visit: Payer: Medicaid Other | Attending: Internal Medicine | Admitting: Rehabilitative and Restorative Service Providers"

## 2011-06-09 ENCOUNTER — Emergency Department (HOSPITAL_COMMUNITY)
Admission: EM | Admit: 2011-06-09 | Discharge: 2011-06-09 | Disposition: A | Discharge status: Home / Self Care | Payer: Medicaid Other | Attending: Emergency Medicine | Admitting: Emergency Medicine

## 2011-06-09 ENCOUNTER — Encounter (HOSPITAL_COMMUNITY): Payer: Self-pay

## 2011-06-09 DIAGNOSIS — Z79899 Other long term (current) drug therapy: Secondary | ICD-10-CM | POA: Insufficient documentation

## 2011-06-09 DIAGNOSIS — F3289 Other specified depressive episodes: Secondary | ICD-10-CM | POA: Insufficient documentation

## 2011-06-09 DIAGNOSIS — I10 Essential (primary) hypertension: Secondary | ICD-10-CM | POA: Insufficient documentation

## 2011-06-09 DIAGNOSIS — M25469 Effusion, unspecified knee: Secondary | ICD-10-CM | POA: Insufficient documentation

## 2011-06-09 DIAGNOSIS — F329 Major depressive disorder, single episode, unspecified: Secondary | ICD-10-CM | POA: Insufficient documentation

## 2011-06-09 DIAGNOSIS — M25561 Pain in right knee: Secondary | ICD-10-CM

## 2011-06-09 DIAGNOSIS — M25669 Stiffness of unspecified knee, not elsewhere classified: Secondary | ICD-10-CM | POA: Insufficient documentation

## 2011-06-09 DIAGNOSIS — M25569 Pain in unspecified knee: Secondary | ICD-10-CM | POA: Insufficient documentation

## 2011-06-09 MED ORDER — OXYCODONE-ACETAMINOPHEN 5-325 MG PO TABS: 1.0000 | ORAL_TABLET | Freq: Four times a day (QID) | ORAL | Status: AC | PRN

## 2011-06-09 NOTE — ED Provider Notes (Signed)
History     CSN: [id number]  Arrival date & time 06/09/11  1219   First MD Initiated Contact with Patient 06/09/11 1303      No chief complaint on file.   (Consider location/radiation/quality/duration/timing/severity/associated sxs/prior treatment) HPI Comments: Patient reports that she had knee surgery done on her right knee in December 2012.  She reports that she has had pain of her right knee ever since.  She is unsure of the details of the surgery.  She is unsure who the surgeon was.  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  Pain associated with stiffness and intermittent swelling.  No acute trauma or injury.  She has taken Percocet in the past, but has run out of the medication.  Patient is a 46 y.o. female presenting with knee pain. The history is provided by the patient.  Knee Pain This is a chronic problem. The problem has been gradually worsening. Associated symptoms include joint swelling. Pertinent negatives include no chills, fever, nausea, numbness or vomiting. The symptoms are aggravated by bending and walking.    Past Medical History  Diagnosis Date  . Depression   . Hypertension     Past Surgical History  Procedure Date  . Cesarean section   . Knee surgery     No family history on file.  History  Substance Use Topics  . Smoking status: Current Everyday Smoker -- 0.5 packs/day  . Smokeless tobacco: Not on file  . Alcohol Use: No    OB History    Grav Para Term Preterm Abortions TAB SAB Ect Mult Living                  Review of Systems  Constitutional: Negative for fever and chills.  Gastrointestinal: Negative for nausea and vomiting.  Musculoskeletal: Positive for joint swelling. Negative for gait problem.  Skin: Negative for color change.  Neurological: Negative for numbness.    Allergies  Review of patient's allergies indicates no known allergies.  Home Medications   Current Outpatient Rx  Name Route Sig Dispense Refill  .  ALBUTEROL SULFATE HFA 108 (90 BASE) MCG/ACT IN AERS Inhalation Inhale 2 puffs into the lungs every 6 (six) hours as needed. For shortness of breath     . ARIPIPRAZOLE 5 MG PO TABS Oral Take 5 mg by mouth daily.      . ATORVASTATIN CALCIUM 20 MG PO TABS Oral Take 20 mg by mouth daily.      Marland Kitchen VITAMIN B12 PO Oral Take 1 tablet by mouth daily.      . ENALAPRIL-HYDROCHLOROTHIAZIDE 10-25 MG PO TABS Oral Take 1 tablet by mouth daily.    Marland Kitchen HYDROCODONE-ACETAMINOPHEN 5-325 MG PO TABS Oral Take 1 tablet by mouth 3 (three) times daily.    Marland Kitchen LAMOTRIGINE 200 MG PO TABS Oral Take 200 mg by mouth daily.    Marland Kitchen NAPHAZOLINE HCL 0.012 % OP SOLN Ophthalmic Apply 1 drop to eye as needed. For red, irritated eyes     . OMEPRAZOLE 20 MG PO CPDR Oral Take 20 mg by mouth daily.    . OXYCODONE-ACETAMINOPHEN 10-650 MG PO TABS Oral Take 1 tablet by mouth every 4 (four) hours as needed. For pain.       BP 124/91  Pulse 89  Temp(Src) 97.9 F (36.6 C) (Oral)  Resp 16  SpO2 100%  LMP 05/28/2011  Physical Exam  Nursing note and vitals reviewed. Constitutional: She appears well-developed and well-nourished. No distress.  HENT:  Head: Normocephalic and atraumatic.  Cardiovascular: Normal rate, regular rhythm and normal heart sounds.   Pulses:      Dorsalis pedis pulses are 2+ on the right side, and 2+ on the left side.  Pulmonary/Chest: Effort normal and breath sounds normal.  Musculoskeletal:       Right knee: She exhibits normal range of motion, no swelling, no effusion, no deformity, no erythema and no bony tenderness. no tenderness found.  Neurological: She is alert. No sensory deficit. Gait normal.  Skin: Skin is warm and dry. She is not diaphoretic. No erythema.  Psychiatric: She has a normal mood and affect.    ED Course  Procedures (including critical care time)  Labs Reviewed - No data to display No results found.   No diagnosis found.    MDM  Patient presenting with knee pain that has been  present since December, which is gradually worsening.  No acute injury or trauma.  No erythema, swelling, or warmth.  Full ROM of knee.  Therefore, doubt septic joint or gout.  Patient instructed to follow up with the Orthopedist that she has done in the past.          Magnus Sinning, PA-C 06/09/11 [phone]

## 2011-06-09 NOTE — ED Notes (Signed)
Pt c/o right knee pain. Pain is ongoing and has gotten worse. Previous knee surgery December 2012 where bolts were placed. Pt states that when she walks or bend right knee "it feels like it is rubbing together". Denies any recent injury to right knee.

## 2011-06-10 NOTE — ED Provider Notes (Signed)
Medical screening examination/treatment/procedure(s) were performed by non-physician practitioner and as supervising physician I was immediately available for consultation/collaboration.  Cheri Guppy, MD 06/10/11 [phone]

## 2012-01-27 ENCOUNTER — Emergency Department (HOSPITAL_COMMUNITY)
Admission: EM | Admit: 2012-01-27 | Discharge: 2012-01-27 | Disposition: A | Discharge status: Home / Self Care | Payer: Medicaid Other | Attending: Emergency Medicine | Admitting: Emergency Medicine

## 2012-01-27 ENCOUNTER — Encounter (HOSPITAL_COMMUNITY): Payer: Self-pay | Admitting: Emergency Medicine

## 2012-01-27 DIAGNOSIS — Z79899 Other long term (current) drug therapy: Secondary | ICD-10-CM | POA: Insufficient documentation

## 2012-01-27 DIAGNOSIS — F329 Major depressive disorder, single episode, unspecified: Secondary | ICD-10-CM | POA: Insufficient documentation

## 2012-01-27 DIAGNOSIS — I10 Essential (primary) hypertension: Secondary | ICD-10-CM | POA: Insufficient documentation

## 2012-01-27 DIAGNOSIS — R509 Fever, unspecified: Secondary | ICD-10-CM | POA: Insufficient documentation

## 2012-01-27 DIAGNOSIS — J029 Acute pharyngitis, unspecified: Secondary | ICD-10-CM | POA: Insufficient documentation

## 2012-01-27 DIAGNOSIS — F3289 Other specified depressive episodes: Secondary | ICD-10-CM | POA: Insufficient documentation

## 2012-01-27 DIAGNOSIS — R51 Headache: Secondary | ICD-10-CM | POA: Insufficient documentation

## 2012-01-27 DIAGNOSIS — F17200 Nicotine dependence, unspecified, uncomplicated: Secondary | ICD-10-CM | POA: Insufficient documentation

## 2012-01-27 DIAGNOSIS — J020 Streptococcal pharyngitis: Secondary | ICD-10-CM

## 2012-01-27 DIAGNOSIS — R52 Pain, unspecified: Secondary | ICD-10-CM | POA: Insufficient documentation

## 2012-01-27 MED ORDER — DEXAMETHASONE 10 MG/ML FOR PEDIATRIC ORAL USE
10.0000 mg | Freq: Once | INTRAMUSCULAR | Status: AC
  Administered 2012-01-27: 10 mg via ORAL
  Filled 2012-01-27: qty 1

## 2012-01-27 MED ORDER — PENICILLIN G BENZATHINE 1200000 UNIT/2ML IM SUSP
1.2000 10*6.[IU] | Freq: Once | INTRAMUSCULAR | Status: AC
  Administered 2012-01-27: 1.2 10*6.[IU] via INTRAMUSCULAR
  Filled 2012-01-27: qty 2

## 2012-01-27 MED ORDER — HYDROCODONE-ACETAMINOPHEN 7.5-500 MG/15ML PO SOLN
10.0000 mL | Freq: Once | ORAL | Status: AC
  Administered 2012-01-27: 10 mL via ORAL
  Filled 2012-01-27: qty 15

## 2012-01-27 MED ORDER — HYDROCODONE-ACETAMINOPHEN 7.5-500 MG/15ML PO SOLN: 10.0000 mL | ORAL | Status: DC | PRN

## 2012-01-27 NOTE — ED Provider Notes (Signed)
History     CSN: [id number]  Arrival date & time 01/27/12  0320   First MD Initiated Contact with Patient 01/27/12 [phone]      Chief Complaint  Patient presents with  . Generalized Body Aches  . Sore Throat    (Consider location/radiation/quality/duration/timing/severity/associated sxs/prior treatment) Patient is a 47 y.o. female presenting with pharyngitis. The history is provided by the patient.  Sore Throat This is a new problem. The current episode started yesterday. The problem has been gradually worsening. Associated symptoms include a fever, headaches, a sore throat and swollen glands. Pertinent negatives include no chills or nausea. The symptoms are aggravated by swallowing. She has tried acetaminophen for the symptoms. The treatment provided no relief.    Past Medical History  Diagnosis Date  . Depression   . Hypertension     Past Surgical History  Procedure Date  . Cesarean section   . Knee surgery     No family history on file.  History  Substance Use Topics  . Smoking status: Current Every Day Smoker -- 0.5 packs/day  . Smokeless tobacco: Not on file  . Alcohol Use: No    OB History    Grav Para Term Preterm Abortions TAB SAB Ect Mult Living                  Review of Systems  Constitutional: Positive for fever. Negative for chills.  HENT: Positive for sore throat.   Gastrointestinal: Negative for nausea.  Neurological: Positive for headaches.    Allergies  Review of patient's allergies indicates no known allergies.  Home Medications   Current Outpatient Rx  Name  Route  Sig  Dispense  Refill  . ALBUTEROL SULFATE HFA 108 (90 BASE) MCG/ACT IN AERS   Inhalation   Inhale 2 puffs into the lungs every 6 (six) hours as needed. For shortness of breath          . ARIPIPRAZOLE 5 MG PO TABS   Oral   Take 5 mg by mouth daily.           . ENALAPRIL-HYDROCHLOROTHIAZIDE 10-25 MG PO TABS   Oral   Take 1 tablet by mouth daily.         Marland Kitchen  OMEPRAZOLE 20 MG PO CPDR   Oral   Take 20 mg by mouth daily.         Marland Kitchen HYDROCODONE-ACETAMINOPHEN 7.5-500 MG/15ML PO SOLN   Oral   Take 10 mLs by mouth every 4 (four) hours as needed for pain.   120 mL   0     BP 123/67  Pulse 109  Temp 102.7 F (39.3 C) (Oral)  Resp 18  SpO2 100%  Physical Exam  Constitutional: She is oriented to person, place, and time. She appears well-developed and well-nourished.  HENT:  Head: Normocephalic and atraumatic.  Right Ear: External ear normal.  Left Ear: External ear normal.  Mouth/Throat: Uvula is midline. Oropharyngeal exudate and posterior oropharyngeal edema present. No tonsillar abscesses.  Neck: Normal range of motion.  Cardiovascular: Tachycardia present.   Abdominal: Soft.  Musculoskeletal: Normal range of motion. She exhibits no edema and no tenderness.  Neurological: She is alert and oriented to person, place, and time.  Skin: Skin is warm and dry. No rash noted. No pallor.    ED Course  Procedures (including critical care time)  Labs Reviewed  RAPID STREP SCREEN - Abnormal; Notable for the following:    Streptococcus, Group A Screen (Direct) POSITIVE (*)  All other components within normal limits   No results found.   1. Strep pharyngitis       MDM  Positive strep will treat with IM Bicillin PO Decadron and PO Lortab         Arman Filter, NP 01/27/12 [phone]

## 2012-01-27 NOTE — ED Notes (Signed)
As per EMS, pt c/o generalized body aches and sore throat x 3 days. pt has been out of blood pressure meds x1 month.No V/D

## 2012-01-27 NOTE — ED Provider Notes (Signed)
Medical screening examination/treatment/procedure(s) were performed by non-physician practitioner and as supervising physician I was immediately available for consultation/collaboration.  Olivia Mackie, MD 01/27/12 [phone]

## 2012-01-27 NOTE — ED Notes (Signed)
ZOX:[id number]<AV> Expected date:01/27/12<BR> Expected time: 3:14 AM<BR> Means of arrival:Ambulance<BR> Comments:<BR> Flu like sxs

## 2012-05-30 ENCOUNTER — Encounter (HOSPITAL_COMMUNITY): Payer: Self-pay

## 2012-05-30 ENCOUNTER — Emergency Department (HOSPITAL_COMMUNITY)
Admission: EM | Admit: 2012-05-30 | Discharge: 2012-05-30 | Disposition: A | Discharge status: Home / Self Care | Payer: Medicaid Other | Attending: Emergency Medicine | Admitting: Emergency Medicine

## 2012-05-30 DIAGNOSIS — Z008 Encounter for other general examination: Secondary | ICD-10-CM | POA: Insufficient documentation

## 2012-05-30 DIAGNOSIS — F32A Depression, unspecified: Secondary | ICD-10-CM

## 2012-05-30 DIAGNOSIS — I10 Essential (primary) hypertension: Secondary | ICD-10-CM

## 2012-05-30 DIAGNOSIS — R440 Auditory hallucinations: Secondary | ICD-10-CM

## 2012-05-30 DIAGNOSIS — R45851 Suicidal ideations: Secondary | ICD-10-CM

## 2012-05-30 DIAGNOSIS — Z79899 Other long term (current) drug therapy: Secondary | ICD-10-CM | POA: Insufficient documentation

## 2012-05-30 DIAGNOSIS — F329 Major depressive disorder, single episode, unspecified: Secondary | ICD-10-CM

## 2012-05-30 DIAGNOSIS — Z3202 Encounter for pregnancy test, result negative: Secondary | ICD-10-CM | POA: Insufficient documentation

## 2012-05-30 DIAGNOSIS — R443 Hallucinations, unspecified: Secondary | ICD-10-CM | POA: Insufficient documentation

## 2012-05-30 DIAGNOSIS — F17200 Nicotine dependence, unspecified, uncomplicated: Secondary | ICD-10-CM | POA: Insufficient documentation

## 2012-05-30 DIAGNOSIS — F3289 Other specified depressive episodes: Secondary | ICD-10-CM | POA: Insufficient documentation

## 2012-05-30 LAB — CBC WITH DIFFERENTIAL/PLATELET
Basophils Absolute: 0 10*3/uL (ref 0.0–0.1)
Eosinophils Absolute: 0.1 10*3/uL (ref 0.0–0.7)
Eosinophils Relative: 1 % (ref 0–5)
Lymphocytes Relative: 28 % (ref 12–46)
MCV: 81.1 fL (ref 78.0–100.0)
Neutrophils Relative %: 66 % (ref 43–77)
Platelets: 250 10*3/uL (ref 150–400)
RDW: 15.7 % — ABNORMAL HIGH (ref 11.5–15.5)
WBC: 10.3 10*3/uL (ref 4.0–10.5)

## 2012-05-30 LAB — BASIC METABOLIC PANEL
CO2: 27 mEq/L (ref 19–32)
Calcium: 9.2 mg/dL (ref 8.4–10.5)
GFR calc non Af Amer: >90 mL/min (ref >90)
Potassium: 3.8 mEq/L (ref 3.5–5.1)
Sodium: 139 mEq/L (ref 135–145)

## 2012-05-30 LAB — PREGNANCY, URINE: Preg Test, Ur: NEGATIVE

## 2012-05-30 LAB — RAPID URINE DRUG SCREEN, HOSP PERFORMED: Opiates: NOT DETECTED

## 2012-05-30 LAB — ETHANOL: Alcohol, Ethyl (B): <11 mg/dL (ref 0–11)

## 2012-05-30 MED ORDER — ENALAPRIL-HYDROCHLOROTHIAZIDE 10-25 MG PO TABS: 1.0000 | ORAL_TABLET | Freq: Every day | ORAL | Status: DC

## 2012-05-30 MED ORDER — IBUPROFEN 800 MG PO TABS
800.0000 mg | ORAL_TABLET | Freq: Once | ORAL | Status: AC
  Administered 2012-05-30: 800 mg via ORAL
  Filled 2012-05-30: qty 1

## 2012-05-30 NOTE — ED Notes (Signed)
Caprice Beaver RN at [place] states ok to send patient back to [facility]

## 2012-05-30 NOTE — ED Notes (Signed)
Patient is from [place] and is to return to [place]. Patient has IVC papers that indicate that the patient is suicidal and that she has a plan to cut or stab herself. Patient denies having a plan. Patient reports that she is hearing voices, but does not understand what they are telling her.

## 2012-05-30 NOTE — ED Provider Notes (Signed)
History    This chart was scribed for non-physician practitioner, Dorthula Matas PA-C working with Hurman Horn, MD by Donne Anon, ED Scribe. This patient was seen in room WTR1/WLPT1 and the patient's care was started at 1853.   CSN: [id number]  Arrival date & time 05/30/12  1813   First MD Initiated Contact with Patient 05/30/12 1853      Chief Complaint  Patient presents with  . Medical Clearance  . Suicidal     The history is provided by the patient. No language interpreter was used.   HPI Comments: Monique Fowler is a 47 y.o. female who presents to the Emergency Department complaining of SI. She is here from [place] and is being involuntarily committed by one of the physicians there. She has a plan to cut or stab herself, but has never acted on it. She reports she hears voices that scare her, but does not understand what they are telling her. She has a h/o depression and has had multiple hospitalizations.  She is believed to be a danger to herself. She denies substance abuse. She denies any recent stressors of her depression. She states last week she had homicidal ideation (no one in specific, no stressing factor, just anyone in general).  Past Medical History  Diagnosis Date  . Depression   . Hypertension     Past Surgical History  Procedure Laterality Date  . Cesarean section    . Knee surgery      History reviewed. No pertinent family history.  History  Substance Use Topics  . Smoking status: Current Every Day Smoker -- 0.50 packs/day  . Smokeless tobacco: Not on file  . Alcohol Use: No    OB History   Grav Para Term Preterm Abortions TAB SAB Ect Mult Living                  Review of Systems  Psychiatric/Behavioral: Positive for suicidal ideas.  All other systems reviewed and are negative.    Allergies  Review of patient's allergies indicates no known allergies.  Home Medications   Current Outpatient Rx  Name  Route  Sig  Dispense  Refill  .  albuterol (PROVENTIL HFA;VENTOLIN HFA) 108 (90 BASE) MCG/ACT inhaler   Inhalation   Inhale 2 puffs into the lungs every 6 (six) hours as needed. For shortness of breath          . enalapril-hydrochlorothiazide (VASERETIC) 10-25 MG per tablet   Oral   Take 1 tablet by mouth daily.   30 tablet   0   . omeprazole (PRILOSEC) 20 MG capsule   Oral   Take 20 mg by mouth daily.           BP 135/93  Pulse 76  Temp(Src) 98.5 F (36.9 C) (Oral)  Resp 18  Ht 5' 7.5" (1.715 m)  Wt 194 lb (87.998 kg)  BMI 29.92 kg/m2  SpO2 99%  LMP 01/29/2012  Physical Exam  Nursing note and vitals reviewed. Constitutional: She is oriented to person, place, and time. She appears well-developed and well-nourished. No distress.  HENT:  Head: Normocephalic and atraumatic.  Eyes: EOM are normal.  Neck: Neck supple. No tracheal deviation present.  Cardiovascular: Normal rate.   Pulmonary/Chest: Effort normal. No respiratory distress.  Musculoskeletal: Normal range of motion.  Neurological: She is alert and oriented to person, place, and time.  Skin: Skin is warm and dry.  Psychiatric: She has a normal mood and affect. Her mood appears  not anxious. She is actively hallucinating. She expresses suicidal ideation. She expresses no homicidal ideation. She expresses no suicidal plans and no homicidal plans.    ED Course  Procedures (including critical care time) DIAGNOSTIC STUDIES: Oxygen Saturation is 99% on room air, normal by my interpretation.    COORDINATION OF CARE: 7:30 PM Discussed treatment plan which includes medical clearance and Rx for BP medication with pt at bedside and pt agreed to plan.   8:52 PM Rechecked pt. Went in, took patients blood pressure. It is elevated at [facility] and is elevated here at 147/97. Will monitor and refill her BP medication.   Labs Reviewed  CBC WITH DIFFERENTIAL - Abnormal; Notable for the following:    RBC 5.45 (*)    RDW 15.7 (*)    All other components  within normal limits  BASIC METABOLIC PANEL - Abnormal; Notable for the following:    Glucose, Bld 106 (*)    BUN 5 (*)    All other components within normal limits  PREGNANCY, URINE  ETHANOL  URINE RAPID DRUG SCREEN (HOSP PERFORMED)   No results found.   1. Hypertension   2. Auditory hallucination   3. Suicidal ideation   4. Depression       MDM     I personally performed the services described in this documentation, which was scribed in my presence. The recorded information has been reviewed and is accurate.        Dorthula Matas, PA-C 05/30/12 2054

## 2012-06-06 NOTE — ED Provider Notes (Signed)
Medical screening examination/treatment/procedure(s) were performed by non-physician practitioner and as supervising physician I was immediately available for consultation/collaboration.   Hurman Horn, MD 06/06/12 [phone]

## 2012-06-16 ENCOUNTER — Emergency Department (HOSPITAL_COMMUNITY)
Admission: EM | Admit: 2012-06-16 | Discharge: 2012-06-16 | Disposition: A | Discharge status: Home / Self Care | Payer: Medicaid Other | Attending: Emergency Medicine | Admitting: Emergency Medicine

## 2012-06-16 ENCOUNTER — Encounter (HOSPITAL_COMMUNITY): Payer: Self-pay | Admitting: Emergency Medicine

## 2012-06-16 DIAGNOSIS — Z79899 Other long term (current) drug therapy: Secondary | ICD-10-CM | POA: Insufficient documentation

## 2012-06-16 DIAGNOSIS — F17200 Nicotine dependence, unspecified, uncomplicated: Secondary | ICD-10-CM | POA: Insufficient documentation

## 2012-06-16 DIAGNOSIS — M25561 Pain in right knee: Secondary | ICD-10-CM

## 2012-06-16 DIAGNOSIS — Z8659 Personal history of other mental and behavioral disorders: Secondary | ICD-10-CM | POA: Insufficient documentation

## 2012-06-16 DIAGNOSIS — I10 Essential (primary) hypertension: Secondary | ICD-10-CM | POA: Insufficient documentation

## 2012-06-16 DIAGNOSIS — M25569 Pain in unspecified knee: Secondary | ICD-10-CM | POA: Insufficient documentation

## 2012-06-16 DIAGNOSIS — Z9889 Other specified postprocedural states: Secondary | ICD-10-CM | POA: Insufficient documentation

## 2012-06-16 DIAGNOSIS — G8929 Other chronic pain: Secondary | ICD-10-CM | POA: Insufficient documentation

## 2012-06-16 HISTORY — DX: Bipolar disorder, unspecified: F31.9

## 2012-06-16 MED ORDER — METHOCARBAMOL 500 MG PO TABS: 500.0000 mg | ORAL_TABLET | Freq: Two times a day (BID) | ORAL | Status: DC

## 2012-06-16 MED ORDER — IBUPROFEN 600 MG PO TABS: 600.0000 mg | ORAL_TABLET | Freq: Four times a day (QID) | ORAL | Status: DC | PRN

## 2012-06-16 NOTE — ED Notes (Signed)
Pt states she had R knee surgery a year ago and has since had pain on and off when walking.  Pt able to ambulate. Pain worse when walking.

## 2012-06-16 NOTE — ED Provider Notes (Signed)
History     CSN: [id number]  Arrival date & time 06/16/12  1147   First MD Initiated Contact with Patient 06/16/12 1152      Chief Complaint  Patient presents with  . Leg Pain    (Consider location/radiation/quality/duration/timing/severity/associated sxs/prior treatment) Patient is a 47 y.o. female presenting with leg pain. The history is provided by the patient.  Leg Pain pt here with chronic right knee pain times one year after having surgery. Denies any new injuries. Pain is worse with ambulation and characterized as sharp and better with rest. No treatment used prior to arrival. Denies any fever or chills. Denies any hip pain or ankle pain. Has not followed with her orthopedist  Past Medical History  Diagnosis Date  . Depression   . Hypertension   . Bipolar affective     Past Surgical History  Procedure Laterality Date  . Cesarean section    . Knee surgery      History reviewed. No pertinent family history.  History  Substance Use Topics  . Smoking status: Current Every Day Smoker -- 0.50 packs/day  . Smokeless tobacco: Not on file  . Alcohol Use: No    OB History   Grav Para Term Preterm Abortions TAB SAB Ect Mult Living                  Review of Systems  All other systems reviewed and are negative.    Allergies  Review of patient's allergies indicates no known allergies.  Home Medications   Current Outpatient Rx  Name  Route  Sig  Dispense  Refill  . albuterol (PROVENTIL HFA;VENTOLIN HFA) 108 (90 BASE) MCG/ACT inhaler   Inhalation   Inhale 2 puffs into the lungs every 6 (six) hours as needed for shortness of breath.          . enalapril-hydrochlorothiazide (VASERETIC) 10-25 MG per tablet   Oral   Take 1 tablet by mouth daily.   30 tablet   0   . omeprazole (PRILOSEC) 20 MG capsule   Oral   Take 20 mg by mouth daily.         Marland Kitchen ibuprofen (ADVIL,MOTRIN) 600 MG tablet   Oral   Take 1 tablet (600 mg total) by mouth every 6 (six) hours  as needed for pain.   30 tablet   0   . methocarbamol (ROBAXIN) 500 MG tablet   Oral   Take 1 tablet (500 mg total) by mouth 2 (two) times daily.   20 tablet   0     BP 128/85  Pulse 89  Temp(Src) 98.8 F (37.1 C) (Oral)  Resp 16  SpO2 98%  LMP 01/29/2012  Physical Exam  Nursing note and vitals reviewed. Constitutional: She is oriented to person, place, and time. She appears well-developed and well-nourished.  Non-toxic appearance.  HENT:  Head: Normocephalic and atraumatic.  Eyes: Conjunctivae are normal. Pupils are equal, round, and reactive to light.  Neck: Normal range of motion.  Cardiovascular: Normal rate.   Pulmonary/Chest: Effort normal.  Musculoskeletal:       Legs: Neurological: She is alert and oriented to person, place, and time.  Skin: Skin is warm and dry.  Psychiatric: She has a normal mood and affect.    ED Course  Procedures (including critical care time)  Labs Reviewed - No data to display No results found.   1. Knee pain, right       MDM  Pt with full range of  motion, no joint effusion--pt given meds and told to f/u her orthopedist        Toy Baker, MD 06/16/12 1207

## 2012-06-30 ENCOUNTER — Emergency Department (HOSPITAL_COMMUNITY)
Admission: EM | Admit: 2012-06-30 | Discharge: 2012-06-30 | Disposition: A | Discharge status: Home / Self Care | Payer: Medicaid Other | Attending: Emergency Medicine | Admitting: Emergency Medicine

## 2012-06-30 ENCOUNTER — Encounter (HOSPITAL_COMMUNITY): Payer: Self-pay | Admitting: *Deleted

## 2012-06-30 DIAGNOSIS — N949 Unspecified condition associated with female genital organs and menstrual cycle: Secondary | ICD-10-CM | POA: Insufficient documentation

## 2012-06-30 DIAGNOSIS — F329 Major depressive disorder, single episode, unspecified: Secondary | ICD-10-CM | POA: Insufficient documentation

## 2012-06-30 DIAGNOSIS — I10 Essential (primary) hypertension: Secondary | ICD-10-CM | POA: Insufficient documentation

## 2012-06-30 DIAGNOSIS — N926 Irregular menstruation, unspecified: Secondary | ICD-10-CM | POA: Insufficient documentation

## 2012-06-30 DIAGNOSIS — M545 Low back pain, unspecified: Secondary | ICD-10-CM | POA: Insufficient documentation

## 2012-06-30 DIAGNOSIS — F17200 Nicotine dependence, unspecified, uncomplicated: Secondary | ICD-10-CM | POA: Insufficient documentation

## 2012-06-30 DIAGNOSIS — N938 Other specified abnormal uterine and vaginal bleeding: Secondary | ICD-10-CM | POA: Insufficient documentation

## 2012-06-30 DIAGNOSIS — F319 Bipolar disorder, unspecified: Secondary | ICD-10-CM | POA: Insufficient documentation

## 2012-06-30 DIAGNOSIS — N939 Abnormal uterine and vaginal bleeding, unspecified: Secondary | ICD-10-CM | POA: Insufficient documentation

## 2012-06-30 DIAGNOSIS — Z79899 Other long term (current) drug therapy: Secondary | ICD-10-CM | POA: Insufficient documentation

## 2012-06-30 DIAGNOSIS — F3289 Other specified depressive episodes: Secondary | ICD-10-CM | POA: Insufficient documentation

## 2012-06-30 DIAGNOSIS — Z3202 Encounter for pregnancy test, result negative: Secondary | ICD-10-CM | POA: Insufficient documentation

## 2012-06-30 LAB — URINALYSIS, ROUTINE W REFLEX MICROSCOPIC
Glucose, UA: NEGATIVE mg/dL
Protein, ur: NEGATIVE mg/dL

## 2012-06-30 LAB — CBC WITH DIFFERENTIAL/PLATELET
Basophils Absolute: 0.1 10*3/uL (ref 0.0–0.1)
Basophils Relative: 1 % (ref 0–1)
MCHC: 32.5 g/dL (ref 30.0–36.0)
Neutro Abs: 5.7 10*3/uL (ref 1.7–7.7)
Neutrophils Relative %: 65 % (ref 43–77)
Platelets: 229 10*3/uL (ref 150–400)
RDW: 15.3 % (ref 11.5–15.5)

## 2012-06-30 LAB — COMPREHENSIVE METABOLIC PANEL
ALT: 7 U/L (ref 0–35)
Alkaline Phosphatase: 74 U/L (ref 39–117)
CO2: 24 mEq/L (ref 19–32)
Calcium: 9.1 mg/dL (ref 8.4–10.5)
GFR calc Af Amer: >90 mL/min (ref >90)
GFR calc non Af Amer: >90 mL/min (ref >90)
Glucose, Bld: 109 mg/dL — ABNORMAL HIGH (ref 70–99)
Sodium: 136 mEq/L (ref 135–145)

## 2012-06-30 LAB — URINE MICROSCOPIC-ADD ON

## 2012-06-30 LAB — WET PREP, GENITAL: Clue Cells Wet Prep HPF POC: NONE SEEN

## 2012-06-30 MED ORDER — HYDROCODONE-ACETAMINOPHEN 5-325 MG PO TABS: 1.0000 | ORAL_TABLET | ORAL | Status: DC | PRN

## 2012-06-30 MED ORDER — MEGESTROL ACETATE 40 MG PO TABS: 40.0000 mg | ORAL_TABLET | Freq: Every day | ORAL | Status: DC

## 2012-06-30 MED ORDER — OXYCODONE-ACETAMINOPHEN 5-325 MG PO TABS
2.0000 | ORAL_TABLET | Freq: Once | ORAL | Status: AC
  Administered 2012-06-30: 2 via ORAL
  Filled 2012-06-30: qty 2

## 2012-06-30 MED ORDER — ONDANSETRON 4 MG PO TBDP
4.0000 mg | ORAL_TABLET | Freq: Once | ORAL | Status: AC
  Administered 2012-06-30: 4 mg via ORAL
  Filled 2012-06-30: qty 1

## 2012-06-30 NOTE — ED Provider Notes (Signed)
History     CSN: [id number]  Arrival date & time 06/30/12  1018   First MD Initiated Contact with Patient 06/30/12 1026      Chief Complaint  Patient presents with  . Vaginal Bleeding    (Consider location/radiation/quality/duration/timing/severity/associated sxs/prior treatment) HPI  Monique Fowler is a 47 y.o.female presenting to the ER with complaints of vaginal bleeding that started 11 days ago with cramping of the pelvis and low back. She did not have a period for 6 months prior but had cramps like she may have a period. She denies this ever happening before. She has been passing clots. No weakness, dizziness, n/v/d/fevers. She has an appointment to evaluate this on July 10 but was afraid to wait that long since she is bleeding so heavily. She has a pmh positive for hypertension, bipolar and depression.   Past Medical History  Diagnosis Date  . Depression   . Hypertension   . Bipolar affective     Past Surgical History  Procedure Laterality Date  . Cesarean section    . Knee surgery      History reviewed. No pertinent family history.  History  Substance Use Topics  . Smoking status: Current Every Day Smoker -- 0.50 packs/day  . Smokeless tobacco: Not on file  . Alcohol Use: No    OB History   Grav Para Term Preterm Abortions TAB SAB Ect Mult Living                  Review of Systems  Genitourinary: Positive for vaginal bleeding and menstrual problem.  Musculoskeletal: Positive for back pain.  All other systems reviewed and are negative.    Allergies  Review of patient's allergies indicates no known allergies.  Home Medications   Current Outpatient Rx  Name  Route  Sig  Dispense  Refill  . Aspirin-Acetaminophen-Caffeine 500-325-65 MG PACK   Oral   Take 1 packet by mouth every 6 (six) hours as needed (for pain).         . enalapril (VASOTEC) 10 MG tablet   Oral   Take 10 mg by mouth daily.         . hydrochlorothiazide (HYDRODIURIL) 25 MG  tablet   Oral   Take 25 mg by mouth daily.         Marland Kitchen lamoTRIgine (LAMICTAL) 25 MG tablet   Oral   Take 50 mg by mouth 2 (two) times daily.         . Lurasidone HCl (LATUDA) 20 MG TABS   Oral   Take 20 mg by mouth daily.           BP 119/74  Pulse 79  Temp(Src) 98.3 F (36.8 C) (Oral)  Resp 16  SpO2 99%  Physical Exam  Nursing note and vitals reviewed. Constitutional: She appears well-developed and well-nourished. No distress.  HENT:  Head: Normocephalic and atraumatic.  Eyes: Pupils are equal, round, and reactive to light.  Neck: Normal range of motion. Neck supple.  Cardiovascular: Normal rate and regular rhythm.   Pulmonary/Chest: Effort normal.  Abdominal: Soft. There is tenderness in the suprapubic area. There is no rigidity, no guarding, no CVA tenderness, no tenderness at McBurney's point and negative Murphy's sign.  Genitourinary: Uterus normal. Cervix exhibits no motion tenderness, no discharge and no friability. Right adnexum displays no mass, no tenderness and no fullness. Left adnexum displays no mass, no tenderness and no fullness. There is bleeding around the vagina.  Neurological: She is alert.  Skin: Skin is warm and dry.    ED Course  Procedures (including critical care time)  Labs Reviewed  WET PREP, GENITAL - Abnormal; Notable for the following:    Trich, Wet Prep FEW (*)    WBC, Wet Prep HPF POC FEW (*)    All other components within normal limits  URINALYSIS, ROUTINE W REFLEX MICROSCOPIC - Abnormal; Notable for the following:    APPearance CLOUDY (*)    Hgb urine dipstick LARGE (*)    Leukocytes, UA SMALL (*)    All other components within normal limits  COMPREHENSIVE METABOLIC PANEL - Abnormal; Notable for the following:    Glucose, Bld 109 (*)    Total Bilirubin 0.2 (*)    All other components within normal limits  CBC WITH DIFFERENTIAL - Abnormal; Notable for the following:    RBC 5.16 (*)    All other components within normal limits   URINE MICROSCOPIC-ADD ON - Abnormal; Notable for the following:    Bacteria, UA FEW (*)    All other components within normal limits  GC/CHLAMYDIA PROBE AMP  POCT PREGNANCY, URINE   No results found.   1. Abnormal uterine bleeding       MDM  Patient is hemodynamically stable.  Labs show no concerning findings that require acute intervention.  I spoke with Dr. Jolayne Panther at [facility] and she recommends Megace 40 mg po daily until she see's Dr. Jolayne Panther in the office. She needs to  Call today to schedule appointment. May need endometrial biopsy.  47 y.o.Monique Fowler's evaluation in the Emergency Department is complete. It has been determined that no acute conditions requiring further emergency intervention are present at this time. The patient/guardian have been advised of the diagnosis and plan. We have discussed signs and symptoms that warrant return to the ED, such as changes or worsening in symptoms.  Vital signs are stable at discharge. Filed Vitals:   06/30/12 1048  BP: 119/74  Pulse: 79  Temp: 98.3 F (36.8 C)  Resp: 16    Patient/guardian has voiced understanding and agreed to follow-up with the PCP or specialist.         Dorthula Matas, PA-C 06/30/12 1153

## 2012-06-30 NOTE — ED Notes (Signed)
Pt reports she did not have her period in several months, but still had menstrual cramps; and then started having vaginal bleeding June 8, has been bleeding since and passing clots with lower back pain 10/10 and lower abdominal pain/cramp.s

## 2012-06-30 NOTE — ED Provider Notes (Signed)
Medical screening examination/treatment/procedure(s) were performed by non-physician practitioner and as supervising physician I was immediately available for consultation/collaboration.   Celene Kras, MD 06/30/12 [phone]

## 2012-07-01 ENCOUNTER — Inpatient Hospital Stay (HOSPITAL_COMMUNITY)
Admission: AD | Admit: 2012-07-01 | Discharge: 2012-07-01 | Disposition: A | Discharge status: Home / Self Care | Payer: Medicaid Other | Source: Ambulatory Visit | Attending: Obstetrics & Gynecology | Admitting: Obstetrics & Gynecology

## 2012-07-01 NOTE — MAU Note (Addendum)
Was seen at [facility] for bleeding(no period for 6months then started bleeding) Dr Jolayne Panther was consulted and pt has appt in July. Does not feel she can wait for July as she is going "in for treatment".  Pt pulled out paperwork from [facility].  Asked pt about the Megace, pt had not filled prescriptions yet- explained to her that the megace was to help stop the bleeding- pt states- they did not explain that.  Reviewed meds and plan- she said I will go get that filled and thank you.  No other complaints or concerns from pt.

## 2012-07-06 ENCOUNTER — Emergency Department: Payer: Self-pay | Admitting: Emergency Medicine

## 2012-07-06 LAB — CBC
HCT: 40 % (ref 35.0–47.0)
HGB: 13.5 g/dL (ref 12.0–16.0)
MCH: 27.3 pg (ref 26.0–34.0)
Platelet: 242 10*3/uL (ref 150–440)
RBC: 4.95 10*6/uL (ref 3.80–5.20)
WBC: 9.9 10*3/uL (ref 3.6–11.0)

## 2012-07-06 LAB — DRUG SCREEN, URINE
Benzodiazepine, Ur Scrn: NEGATIVE (ref ?–200)
Cannabinoid 50 Ng, Ur ~~LOC~~: NEGATIVE (ref ?–50)
Cocaine Metabolite,Ur ~~LOC~~: NEGATIVE (ref ?–300)
MDMA (Ecstasy)Ur Screen: NEGATIVE (ref ?–500)
Opiate, Ur Screen: NEGATIVE (ref ?–300)
Phencyclidine (PCP) Ur S: NEGATIVE (ref ?–25)

## 2012-07-06 LAB — URINALYSIS, COMPLETE
Bilirubin,UR: NEGATIVE
Glucose,UR: NEGATIVE mg/dL (ref 0–75)
Ketone: NEGATIVE
Nitrite: NEGATIVE

## 2012-07-06 LAB — COMPREHENSIVE METABOLIC PANEL
Albumin: 3.6 g/dL (ref 3.4–5.0)
Anion Gap: 9 (ref 7–16)
BUN: 8 mg/dL (ref 7–18)
Bilirubin,Total: 0.2 mg/dL (ref 0.2–1.0)
Calcium, Total: 8.7 mg/dL (ref 8.5–10.1)
Chloride: 109 mmol/L — ABNORMAL HIGH (ref 98–107)
Co2: 22 mmol/L (ref 21–32)
Creatinine: 0.66 mg/dL (ref 0.60–1.30)
EGFR (African American): >60
EGFR (Non-African Amer.): >60
Glucose: 120 mg/dL — ABNORMAL HIGH (ref 65–99)
Osmolality: 279 (ref 275–301)
SGPT (ALT): 13 U/L (ref 12–78)
Sodium: 140 mmol/L (ref 136–145)
Total Protein: 7.1 g/dL (ref 6.4–8.2)

## 2012-07-06 LAB — TSH: Thyroid Stimulating Horm: 1.4 u[IU]/mL

## 2012-07-06 LAB — ETHANOL: Ethanol %: 0.055 % (ref 0.000–0.080)

## 2012-07-28 ENCOUNTER — Emergency Department (HOSPITAL_COMMUNITY)
Admission: EM | Admit: 2012-07-28 | Discharge: 2012-07-28 | Disposition: A | Discharge status: Home / Self Care | Payer: Medicaid Other | Attending: Emergency Medicine | Admitting: Emergency Medicine

## 2012-07-28 ENCOUNTER — Encounter (HOSPITAL_COMMUNITY): Payer: Self-pay

## 2012-07-28 DIAGNOSIS — R1110 Vomiting, unspecified: Secondary | ICD-10-CM | POA: Insufficient documentation

## 2012-07-28 DIAGNOSIS — R059 Cough, unspecified: Secondary | ICD-10-CM | POA: Insufficient documentation

## 2012-07-28 DIAGNOSIS — I10 Essential (primary) hypertension: Secondary | ICD-10-CM | POA: Insufficient documentation

## 2012-07-28 DIAGNOSIS — J3489 Other specified disorders of nose and nasal sinuses: Secondary | ICD-10-CM | POA: Insufficient documentation

## 2012-07-28 DIAGNOSIS — R05 Cough: Secondary | ICD-10-CM | POA: Insufficient documentation

## 2012-07-28 DIAGNOSIS — IMO0001 Reserved for inherently not codable concepts without codable children: Secondary | ICD-10-CM | POA: Insufficient documentation

## 2012-07-28 DIAGNOSIS — Z79899 Other long term (current) drug therapy: Secondary | ICD-10-CM | POA: Insufficient documentation

## 2012-07-28 DIAGNOSIS — R51 Headache: Secondary | ICD-10-CM | POA: Insufficient documentation

## 2012-07-28 DIAGNOSIS — R52 Pain, unspecified: Secondary | ICD-10-CM | POA: Insufficient documentation

## 2012-07-28 DIAGNOSIS — J029 Acute pharyngitis, unspecified: Secondary | ICD-10-CM | POA: Insufficient documentation

## 2012-07-28 DIAGNOSIS — F319 Bipolar disorder, unspecified: Secondary | ICD-10-CM | POA: Insufficient documentation

## 2012-07-28 DIAGNOSIS — R0989 Other specified symptoms and signs involving the circulatory and respiratory systems: Secondary | ICD-10-CM | POA: Insufficient documentation

## 2012-07-28 DIAGNOSIS — F17200 Nicotine dependence, unspecified, uncomplicated: Secondary | ICD-10-CM | POA: Insufficient documentation

## 2012-07-28 DIAGNOSIS — R509 Fever, unspecified: Secondary | ICD-10-CM | POA: Insufficient documentation

## 2012-07-28 DIAGNOSIS — B349 Viral infection, unspecified: Secondary | ICD-10-CM

## 2012-07-28 MED ORDER — LIDOCAINE VISCOUS 2 % MT SOLN: 15.0000 mL | OROMUCOSAL | Status: DC | PRN

## 2012-07-28 MED ORDER — BENZONATATE 100 MG PO CAPS: 100.0000 mg | ORAL_CAPSULE | Freq: Three times a day (TID) | ORAL | Status: DC

## 2012-07-28 NOTE — ED Provider Notes (Signed)
Medical screening examination/treatment/procedure(s) were performed by non-physician practitioner and as supervising physician I was immediately available for consultation/collaboration.  Khaleesi Gruel R. Aland Chestnutt, MD 07/28/12 2347 

## 2012-07-28 NOTE — ED Notes (Signed)
Per pt, started having sore throat yesterday along with fever of 101.  Also c/o headache and cough.

## 2012-07-28 NOTE — ED Provider Notes (Signed)
History  This chart was scribed for [employer] working with Juliet Rude. Pickering, MD. This patient was seen in room WTR5/WTR5 and the patient's care was started at 7:32 PM.  CSN: [id number]  Arrival date & time 07/28/12  1922   Chief Complaint  Patient presents with  . Sore Throat   Patient is a 47 y.o. female presenting with pharyngitis.  Sore Throat This is a new problem. The current episode started 2 days ago. The problem occurs constantly. The problem has been gradually worsening. Associated symptoms include headaches. Pertinent negatives include no chest pain, no abdominal pain and no shortness of breath. The symptoms are aggravated by swallowing. Nothing relieves the symptoms. She has tried ASA and acetaminophen for the symptoms. The treatment provided no relief.   HPI Comments: Monique Fowler is a 47 y.o. female who presents to the Emergency Department complaining of a gradual onset, gradually worsening sore throat onset yesterday. She states her sore throat is worsened with swallowing. She reports associated fever, headache, vomiting and and dry cough as associated symptoms. She also admits to body aches, congestion and rhinorrhea. She states that her Tmax at home was 101 or 102 F. Triage temp is 99.8 F. She states that her fever onset yesterday and has been waxing and waning since. Pt states that she has taken Ibuprofen, Tylenol and Goody powder without relief. She denies diarrhea, abdominal pain, dysuria, urgency, frequency or any other symptoms. She denies alcohol use and is a current every day smoker of 0.5 packs/day.    PCP- Dr. Almond Lint  Past Medical History  Diagnosis Date  . Depression   . Hypertension   . Bipolar affective    Past Surgical History  Procedure Laterality Date  . Cesarean section    . Knee surgery     History reviewed. No pertinent family history.  History  Substance Use Topics  . Smoking status: Current Every Day Smoker -- 0.50 packs/day   Types: Cigarettes  . Smokeless tobacco: Not on file  . Alcohol Use: No   OB History   Grav Para Term Preterm Abortions TAB SAB Ect Mult Living                 Review of Systems  Constitutional: Positive for fever.  HENT: Positive for congestion, sore throat and rhinorrhea.   Respiratory: Positive for cough. Negative for shortness of breath.   Cardiovascular: Negative for chest pain.  Gastrointestinal: Positive for vomiting. Negative for abdominal pain and diarrhea.  Genitourinary: Negative for dysuria, urgency and frequency.  Musculoskeletal: Positive for myalgias.  Neurological: Positive for headaches.  All other systems reviewed and are negative.   Allergies  Review of patient's allergies indicates no known allergies.  Home Medications   Current Outpatient Rx  Name  Route  Sig  Dispense  Refill  . Aspirin-Acetaminophen-Caffeine 500-325-65 MG PACK   Oral   Take 1 packet by mouth every 6 (six) hours as needed (for pain).         . enalapril (VASOTEC) 10 MG tablet   Oral   Take 10 mg by mouth daily.         . hydrochlorothiazide (HYDRODIURIL) 25 MG tablet   Oral   Take 25 mg by mouth daily.         Marland Kitchen HYDROcodone-acetaminophen (NORCO/VICODIN) 5-325 MG per tablet   Oral   Take 1-2 tablets by mouth every 4 (four) hours as needed for pain.   15 tablet   0   .  lamoTRIgine (LAMICTAL) 25 MG tablet   Oral   Take 50 mg by mouth 2 (two) times daily.         . Lurasidone HCl (LATUDA) 20 MG TABS   Oral   Take 20 mg by mouth daily.         . megestrol (MEGACE) 40 MG tablet   Oral   Take 1 tablet (40 mg total) by mouth daily.   14 tablet   0    Triage Vitals: BP 143/83  Pulse 94  Temp(Src) 99.8 F (37.7 C) (Oral)  Resp 20  SpO2 99%  LMP 06/19/2012  Physical Exam  Nursing note and vitals reviewed. Constitutional: She is oriented to person, place, and time. She appears well-developed and well-nourished. No distress.  HENT:  Head: Normocephalic and  atraumatic.  Mouth/Throat: Uvula is midline and mucous membranes are normal.  Mild erythema of the throat, no exudates. Uvula midline. No tonsillar abscess. Anterior cervical lymphadenopathy.  Eyes: EOM are normal.  Neck: Normal range of motion.  Cardiovascular: Normal rate, regular rhythm and normal heart sounds.   Pulmonary/Chest: Effort normal and breath sounds normal. No respiratory distress.  Abdominal: Soft. She exhibits no distension. There is no tenderness.  Musculoskeletal: Normal range of motion.  Neurological: She is alert and oriented to person, place, and time.  Skin: Skin is warm and dry.  Psychiatric: She has a normal mood and affect. Judgment normal.    ED Course  Procedures (including critical care time)  DIAGNOSTIC STUDIES: Oxygen Saturation is 99% on RA, normal by my interpretation.    COORDINATION OF CARE: 8:03 PM- Pt advised of plan for treatment and pt agrees.    Labs Reviewed  RAPID STREP SCREEN  CULTURE, GROUP A STREP    No results found.  1. Viral illness     MDM  Patient with sore throat, headache, body aches, vomiting, and cough for the past 2 days.  VSS.  Pulse ox 99 on RA.  Lungs CTAB.  Symptoms most consistent with viral illness.  Rapid strep negative.  Patient stable for discharge.  Return precautions given.  I personally performed the services described in this documentation, which was scribed in my presence. The recorded information has been reviewed and is accurate.    Pascal Lux Joseph, PA-C 07/28/12 2212

## 2012-07-30 LAB — CULTURE, GROUP A STREP

## 2012-08-03 ENCOUNTER — Other Ambulatory Visit: Payer: Medicaid Other | Admitting: Obstetrics & Gynecology

## 2012-08-23 ENCOUNTER — Encounter (HOSPITAL_COMMUNITY): Payer: Self-pay | Admitting: *Deleted

## 2012-08-23 ENCOUNTER — Emergency Department (HOSPITAL_COMMUNITY)
Admission: EM | Admit: 2012-08-23 | Discharge: 2012-08-23 | Disposition: A | Discharge status: Home / Self Care | Payer: Medicaid Other | Attending: Emergency Medicine | Admitting: Emergency Medicine

## 2012-08-23 DIAGNOSIS — R6883 Chills (without fever): Secondary | ICD-10-CM | POA: Insufficient documentation

## 2012-08-23 DIAGNOSIS — J069 Acute upper respiratory infection, unspecified: Secondary | ICD-10-CM | POA: Insufficient documentation

## 2012-08-23 DIAGNOSIS — R5381 Other malaise: Secondary | ICD-10-CM | POA: Insufficient documentation

## 2012-08-23 DIAGNOSIS — F17200 Nicotine dependence, unspecified, uncomplicated: Secondary | ICD-10-CM | POA: Insufficient documentation

## 2012-08-23 DIAGNOSIS — Z79899 Other long term (current) drug therapy: Secondary | ICD-10-CM | POA: Insufficient documentation

## 2012-08-23 DIAGNOSIS — I10 Essential (primary) hypertension: Secondary | ICD-10-CM | POA: Insufficient documentation

## 2012-08-23 DIAGNOSIS — R51 Headache: Secondary | ICD-10-CM | POA: Insufficient documentation

## 2012-08-23 DIAGNOSIS — F319 Bipolar disorder, unspecified: Secondary | ICD-10-CM | POA: Insufficient documentation

## 2012-08-23 DIAGNOSIS — R5383 Other fatigue: Secondary | ICD-10-CM | POA: Insufficient documentation

## 2012-08-23 DIAGNOSIS — R05 Cough: Secondary | ICD-10-CM | POA: Insufficient documentation

## 2012-08-23 DIAGNOSIS — R059 Cough, unspecified: Secondary | ICD-10-CM | POA: Insufficient documentation

## 2012-08-23 DIAGNOSIS — J3489 Other specified disorders of nose and nasal sinuses: Secondary | ICD-10-CM | POA: Insufficient documentation

## 2012-08-23 DIAGNOSIS — R197 Diarrhea, unspecified: Secondary | ICD-10-CM | POA: Insufficient documentation

## 2012-08-23 MED ORDER — AMOXICILLIN-POT CLAVULANATE 875-125 MG PO TABS: 1.0000 | ORAL_TABLET | Freq: Two times a day (BID) | ORAL | Status: DC

## 2012-08-23 MED ORDER — CETIRIZINE-PSEUDOEPHEDRINE ER 5-120 MG PO TB12: 1.0000 | ORAL_TABLET | Freq: Two times a day (BID) | ORAL | Status: DC

## 2012-08-23 MED ORDER — HYDROCOD POLST-CHLORPHEN POLST 10-8 MG/5ML PO LQCR: 5.0000 mL | Freq: Two times a day (BID) | ORAL | Status: DC

## 2012-08-23 MED ORDER — GUAIFENESIN ER 600 MG PO TB12: 1200.0000 mg | ORAL_TABLET | Freq: Two times a day (BID) | ORAL | Status: DC

## 2012-08-23 NOTE — ED Provider Notes (Signed)
CSN: [id number]     Arrival date & time 08/23/12  4098 History     First MD Initiated Contact with Patient 08/23/12 0308     Chief Complaint  Patient presents with  . Headache   HPI  History provided by the patient. Patient is a 47 year old female with history of hypertension and bipolar affective disorder who presents with complaints of headache, congestion and cough for the past 5 days. Symptoms first began with some congestion and coughing symptoms. Cough is occasionally productive of clear phlegm. Since that time she has been having waxing and waning headaches that do have some improvement with over-the-counter Goody's powders. Headache continues to return with a throbbing sensation over the right frontal 4 head. She also has some pains to the right posterior and occipital areas of the scalp. Denies any neck pains or stiffness. Denies any associated fever or. Patient does report soft loose stools one to 2 times a day. Denies any episodes of vomiting or abdominal pain. Denies any rhinorrhea or sore throat. She has not used any other medications for symptoms. No other aggravating or alleviating factors. No other associated symptoms.    Past Medical History  Diagnosis Date  . Depression   . Hypertension   . Bipolar affective    Past Surgical History  Procedure Laterality Date  . Cesarean section    . Knee surgery     No family history on file. History  Substance Use Topics  . Smoking status: Current Every Day Smoker -- 0.50 packs/day    Types: Cigarettes  . Smokeless tobacco: Never Used  . Alcohol Use: No   OB History   Grav Para Term Preterm Abortions TAB SAB Ect Mult Living                 Review of Systems  Constitutional: Positive for chills and fatigue. Negative for fever and diaphoresis.  HENT: Positive for congestion and sinus pressure. Negative for sore throat and rhinorrhea.   Respiratory: Positive for cough.   Cardiovascular: Negative for chest pain.   Gastrointestinal: Positive for diarrhea. Negative for nausea, vomiting, abdominal pain, constipation and blood in stool.  Genitourinary: Negative for dysuria, frequency, hematuria and flank pain.  Neurological: Positive for headaches.  All other systems reviewed and are negative.    Allergies  Review of patient's allergies indicates no known allergies.  Home Medications   Current Outpatient Rx  Name  Route  Sig  Dispense  Refill  . Aspirin-Acetaminophen-Caffeine 500-325-65 MG PACK   Oral   Take 1 packet by mouth every 6 (six) hours as needed (for pain).         . enalapril (VASOTEC) 10 MG tablet   Oral   Take 10 mg by mouth daily.         . hydrochlorothiazide (HYDRODIURIL) 25 MG tablet   Oral   Take 25 mg by mouth daily.         Marland Kitchen ibuprofen (ADVIL,MOTRIN) 200 MG tablet   Oral   Take 200 mg by mouth every 6 (six) hours as needed for pain.         Marland Kitchen lamoTRIgine (LAMICTAL) 25 MG tablet   Oral   Take 50 mg by mouth 2 (two) times daily.         Marland Kitchen lidocaine (XYLOCAINE) 2 % solution   Oral   Take 15 mLs by mouth as needed for pain.   100 mL   0   . Lurasidone HCl (LATUDA) 20 MG  TABS   Oral   Take 20 mg by mouth daily.         . megestrol (MEGACE) 40 MG tablet   Oral   Take 1 tablet (40 mg total) by mouth daily.   14 tablet   0    BP 162/94  Pulse 90  Temp(Src) 98.3 F (36.8 C) (Oral)  Resp 20  Ht 5\' 7"  (1.702 m)  Wt 212 lb 12.8 oz (96.525 kg)  BMI 33.32 kg/m2  SpO2 94%  LMP 08/19/2012 Physical Exam  Nursing note and vitals reviewed. Constitutional: She is oriented to person, place, and time. She appears well-developed and well-nourished. No distress.  HENT:  Head: Normocephalic and atraumatic.  Nasal congestion with some pressure over the right maxillary and frontal sinus area.  Eyes: Conjunctivae and EOM are normal. Pupils are equal, round, and reactive to light.  Neck: Normal range of motion. Neck supple.  No meningeal signs   Cardiovascular: Normal rate and regular rhythm.   No murmur heard. Pulmonary/Chest: Effort normal and breath sounds normal. No stridor. No respiratory distress. She has no wheezes. She has no rales.  Occasional coughing  Abdominal: Soft. There is no tenderness. There is no rigidity, no rebound, no guarding, no CVA tenderness and no tenderness at McBurney's point.  Musculoskeletal: Normal range of motion.  Neurological: She is alert and oriented to person, place, and time. She has normal strength. No cranial nerve deficit or sensory deficit. Gait normal.  Skin: Skin is warm and dry. No rash noted.  Psychiatric: She has a normal mood and affect. Her behavior is normal.    ED Course   Procedures      1. Sinus headache   2. URI (upper respiratory infection)     MDM  3:10 AM patient seen and evaluated. Patient resting in bed appears well in no acute distress. Patient is not appear severely ill or toxic. She has had symptoms of cough and congestion with pressure primarily around the frontal sinus. Symptoms consistent with sinusitis and URI. She has a normal nonfocal neuro exam.  Angus Seller, PA-C 08/23/12 [phone]

## 2012-08-23 NOTE — ED Notes (Signed)
Pt states has had a headache on and off for past 5 days on R side, states will take medication and it will go away but then it comes back, states her cousin is in the hospital w/ "puss on his brain", states she is worried and won't leave here until they find out what is wrong.

## 2012-08-23 NOTE — ED Provider Notes (Signed)
Medical screening examination/treatment/procedure(s) were performed by non-physician practitioner and as supervising physician I was immediately available for consultation/collaboration.   Rolan Bucco, MD 08/23/12 [phone]

## 2012-12-30 ENCOUNTER — Emergency Department (HOSPITAL_BASED_OUTPATIENT_CLINIC_OR_DEPARTMENT_OTHER)
Admission: EM | Admit: 2012-12-30 | Discharge: 2012-12-30 | Disposition: A | Discharge status: Home / Self Care | Payer: Medicaid Other | Attending: Emergency Medicine | Admitting: Emergency Medicine

## 2012-12-30 ENCOUNTER — Encounter (HOSPITAL_BASED_OUTPATIENT_CLINIC_OR_DEPARTMENT_OTHER): Payer: Self-pay | Admitting: Emergency Medicine

## 2012-12-30 DIAGNOSIS — F1010 Alcohol abuse, uncomplicated: Secondary | ICD-10-CM | POA: Insufficient documentation

## 2012-12-30 DIAGNOSIS — K089 Disorder of teeth and supporting structures, unspecified: Secondary | ICD-10-CM | POA: Insufficient documentation

## 2012-12-30 DIAGNOSIS — F17200 Nicotine dependence, unspecified, uncomplicated: Secondary | ICD-10-CM | POA: Insufficient documentation

## 2012-12-30 DIAGNOSIS — K029 Dental caries, unspecified: Secondary | ICD-10-CM | POA: Insufficient documentation

## 2012-12-30 DIAGNOSIS — Z79899 Other long term (current) drug therapy: Secondary | ICD-10-CM | POA: Insufficient documentation

## 2012-12-30 DIAGNOSIS — I10 Essential (primary) hypertension: Secondary | ICD-10-CM | POA: Insufficient documentation

## 2012-12-30 DIAGNOSIS — Z98811 Dental restoration status: Secondary | ICD-10-CM | POA: Insufficient documentation

## 2012-12-30 DIAGNOSIS — Z792 Long term (current) use of antibiotics: Secondary | ICD-10-CM | POA: Insufficient documentation

## 2012-12-30 DIAGNOSIS — K0889 Other specified disorders of teeth and supporting structures: Secondary | ICD-10-CM

## 2012-12-30 DIAGNOSIS — F319 Bipolar disorder, unspecified: Secondary | ICD-10-CM | POA: Insufficient documentation

## 2012-12-30 DIAGNOSIS — F1410 Cocaine abuse, uncomplicated: Secondary | ICD-10-CM | POA: Insufficient documentation

## 2012-12-30 MED ORDER — AMOXICILLIN 500 MG PO CAPS: 500.0000 mg | ORAL_CAPSULE | Freq: Three times a day (TID) | ORAL | Status: DC

## 2012-12-30 MED ORDER — IBUPROFEN 800 MG PO TABS: 800.0000 mg | ORAL_TABLET | Freq: Three times a day (TID) | ORAL | Status: DC

## 2012-12-30 NOTE — ED Notes (Signed)
Dental pain. She is a pt at [facility]. States her filling came out of her tooth a week ago.

## 2012-12-30 NOTE — ED Provider Notes (Signed)
CSN: [id number]     Arrival date & time 12/30/12  1410 History   First MD Initiated Contact with Patient 12/30/12 1441     Chief Complaint  Patient presents with  . Dental Pain   (Consider location/radiation/quality/duration/timing/severity/associated sxs/prior Treatment) HPI Comments: She presents to the ER for evaluation of toothache. She reports that she has been having long standing pain on the right upper side of her mouth. She has a large cavity there. She says that her filling came out a week ago and has been having pain ever since.  Patient is a 47 y.o. female presenting with tooth pain.  Dental Pain   Past Medical History  Diagnosis Date  . Depression   . Hypertension   . Bipolar affective   . Cocaine abuse   . Alcohol abuse    Past Surgical History  Procedure Laterality Date  . Cesarean section    . Knee surgery     No family history on file. History  Substance Use Topics  . Smoking status: Current Every Day Smoker -- 0.50 packs/day    Types: Cigarettes  . Smokeless tobacco: Never Used  . Alcohol Use: Yes   OB History   Grav Para Term Preterm Abortions TAB SAB Ect Mult Living                 Review of Systems  HENT: Positive for dental problem.     Allergies  Review of patient's allergies indicates no known allergies.  Home Medications   Current Outpatient Rx  Name  Route  Sig  Dispense  Refill  . ARIPiprazole (ABILIFY) 10 MG tablet   Oral   Take 10 mg by mouth daily.         Marland Kitchen lisinopril (PRINIVIL,ZESTRIL) 20 MG tablet   Oral   Take 20 mg by mouth daily.         . traZODone (DESYREL) 100 MG tablet   Oral   Take 100 mg by mouth at bedtime.         Marland Kitchen amoxicillin-clavulanate (AUGMENTIN) 875-125 MG per tablet   Oral   Take 1 tablet by mouth 2 (two) times daily. One po bid x 10 days   20 tablet   0   . Aspirin-Acetaminophen-Caffeine 500-325-65 MG PACK   Oral   Take 1 packet by mouth every 6 (six) hours as needed (for pain).         . cetirizine-pseudoephedrine (ZYRTEC-D) 5-120 MG per tablet   Oral   Take 1 tablet by mouth 2 (two) times daily.   30 tablet   0   . chlorpheniramine-HYDROcodone (TUSSIONEX PENNKINETIC ER) 10-8 MG/5ML LQCR   Oral   Take 5 mLs by mouth every 12 (twelve) hours. Take 5 mLs by mouth every 12 (twelve) hours.   140 mL   0   . enalapril (VASOTEC) 10 MG tablet   Oral   Take 10 mg by mouth daily.         Marland Kitchen guaiFENesin (MUCINEX) 600 MG 12 hr tablet   Oral   Take 2 tablets (1,200 mg total) by mouth 2 (two) times daily.   60 tablet   0   . hydrochlorothiazide (HYDRODIURIL) 25 MG tablet   Oral   Take 25 mg by mouth daily.         Marland Kitchen ibuprofen (ADVIL,MOTRIN) 200 MG tablet   Oral   Take 200 mg by mouth every 6 (six) hours as needed for pain.         Marland Kitchen  lamoTRIgine (LAMICTAL) 25 MG tablet   Oral   Take 50 mg by mouth 2 (two) times daily.         Marland Kitchen lidocaine (XYLOCAINE) 2 % solution   Oral   Take 15 mLs by mouth as needed for pain.   100 mL   0   . Lurasidone HCl (LATUDA) 20 MG TABS   Oral   Take 20 mg by mouth daily.         . megestrol (MEGACE) 40 MG tablet   Oral   Take 1 tablet (40 mg total) by mouth daily.   14 tablet   0    BP 151/98  Pulse 85  Temp(Src) 97.7 F (36.5 C) (Oral)  Resp 20  Ht 5' 7.5" (1.715 m)  Wt 212 lb (96.163 kg)  BMI 32.69 kg/m2  SpO2 100% Physical Exam  Constitutional: She is oriented to person, place, and time. She appears well-developed and well-nourished. No distress.  HENT:  Head: Normocephalic and atraumatic.  Right Ear: Hearing normal.  Left Ear: Hearing normal.  Nose: Nose normal.  Mouth/Throat: Oropharynx is clear and moist and mucous membranes are normal.    Eyes: Conjunctivae and EOM are normal. Pupils are equal, round, and reactive to light.  Neck: Normal range of motion. Neck supple.  Cardiovascular: Regular rhythm, S1 normal and S2 normal.  Exam reveals no gallop and no friction rub.   No murmur  heard. Pulmonary/Chest: Effort normal and breath sounds normal. No respiratory distress. She exhibits no tenderness.  Abdominal: Soft. Normal appearance and bowel sounds are normal. There is no hepatosplenomegaly. There is no tenderness. There is no rebound, no guarding, no tenderness at McBurney's point and negative Murphy's sign. No hernia.  Musculoskeletal: Normal range of motion.  Neurological: She is alert and oriented to person, place, and time. She has normal strength. No cranial nerve deficit or sensory deficit. Coordination normal. GCS eye subscore is 4. GCS verbal subscore is 5. GCS motor subscore is 6.  Skin: Skin is warm, dry and intact. No rash noted. No cyanosis.  Psychiatric: She has a normal mood and affect. Her speech is normal and behavior is normal. Thought content normal.    ED Course  Procedures (including critical care time) Labs Review Labs Reviewed - No data to display Imaging Review No results found.  EKG Interpretation   None       MDM  Diagnosis: Toothache  Patient with widespread dental decay complaints of toothache. She does have large cavity in the area of the pain. This is currently undergoing rehabilitation, we'll not prescribe any narcotic analgesia. Refer to dentist and initiate antibiotic coverage.    Monique Crease, MD 12/30/12 1450

## 2013-06-28 ENCOUNTER — Emergency Department (HOSPITAL_COMMUNITY): Payer: Medicaid Other

## 2013-06-28 ENCOUNTER — Encounter (HOSPITAL_COMMUNITY): Payer: Self-pay | Admitting: Emergency Medicine

## 2013-06-28 ENCOUNTER — Emergency Department (HOSPITAL_COMMUNITY)
Admission: EM | Admit: 2013-06-28 | Discharge: 2013-06-28 | Disposition: A | Discharge status: Home / Self Care | Payer: Medicaid Other | Attending: Emergency Medicine | Admitting: Emergency Medicine

## 2013-06-28 DIAGNOSIS — M25579 Pain in unspecified ankle and joints of unspecified foot: Secondary | ICD-10-CM | POA: Insufficient documentation

## 2013-06-28 DIAGNOSIS — M79672 Pain in left foot: Secondary | ICD-10-CM

## 2013-06-28 DIAGNOSIS — F17200 Nicotine dependence, unspecified, uncomplicated: Secondary | ICD-10-CM | POA: Insufficient documentation

## 2013-06-28 DIAGNOSIS — Z79899 Other long term (current) drug therapy: Secondary | ICD-10-CM | POA: Insufficient documentation

## 2013-06-28 DIAGNOSIS — F319 Bipolar disorder, unspecified: Secondary | ICD-10-CM | POA: Insufficient documentation

## 2013-06-28 DIAGNOSIS — I10 Essential (primary) hypertension: Secondary | ICD-10-CM | POA: Insufficient documentation

## 2013-06-28 MED ORDER — NAPROXEN 500 MG PO TABS: 500.0000 mg | ORAL_TABLET | Freq: Two times a day (BID) | ORAL | Status: DC

## 2013-06-28 MED ORDER — HYDROCODONE-ACETAMINOPHEN 5-325 MG PO TABS
2.0000 | ORAL_TABLET | Freq: Once | ORAL | Status: AC
  Administered 2013-06-28: 2 via ORAL
  Filled 2013-06-28: qty 2

## 2013-06-28 NOTE — Discharge Instructions (Signed)
Rest, ice and elevate your foot. Take Naprosyn for pain as needed. Follow up with your doctor for further evaluation.

## 2013-06-28 NOTE — ED Provider Notes (Signed)
CSN: [id number]     Arrival date & time 06/28/13  1659 History  This chart was scribed for non-physician practitioner, Alvina Chou, PA-C, working with Janice Norrie, MD by Ladene Artist, ED Scribe. This patient was seen in room WTR7/WTR7 and the patient's care was started at 6:25 PM.   Chief Complaint  Patient presents with  . Foot Pain    left   The history is provided by the patient. No language interpreter was used.  HPI Comments: Monique Fowler is a 48 y.o. female who presents to the Emergency Department complaining of constant L ankle pain onset yesterday. Pt does not recall injury. Pain improves with applying pressure. She has tried [employer] and applying ice with mild relief. Pt missed her pain management appointment yesterday in [facility] where she receives Percocet.   Past Medical History  Diagnosis Date  . Depression   . Hypertension   . Bipolar affective   . Cocaine abuse   . Alcohol abuse    Past Surgical History  Procedure Laterality Date  . Cesarean section    . Knee surgery     No family history on file. History  Substance Use Topics  . Smoking status: Current Every Day Smoker -- 0.50 packs/day    Types: Cigarettes  . Smokeless tobacco: Never Used  . Alcohol Use: Yes   OB History   Grav Para Term Preterm Abortions TAB SAB Ect Mult Living                 Review of Systems  Musculoskeletal: Positive for arthralgias.  All other systems reviewed and are negative.  Allergies  Review of patient's allergies indicates no known allergies.  Home Medications   Prior to Admission medications   Medication Sig Start Date End Date Taking? Authorizing Provider  ARIPiprazole (ABILIFY) 10 MG tablet Take 10 mg by mouth daily.   Yes Historical Provider, MD  Aspirin-Acetaminophen-Caffeine [phone] MG PACK Take 1 packet by mouth every 6 (six) hours as needed (for pain).   Yes Historical Provider, MD  lisinopril (PRINIVIL,ZESTRIL) 20 MG tablet Take 20 mg by mouth  daily.   Yes Historical Provider, MD  oxyCODONE-acetaminophen (PERCOCET/ROXICET) 5-325 MG per tablet Take 1 tablet by mouth every 6 (six) hours as needed for moderate pain or severe pain.   Yes Historical Provider, MD  traZODone (DESYREL) 100 MG tablet Take 100 mg by mouth at bedtime.   Yes Historical Provider, MD   Triage Vitals: BP 128/100  Pulse 92  Temp(Src) 98.2 F (36.8 C) (Oral)  Resp 18  SpO2 98%  LMP 08/19/2012 Physical Exam  Nursing note and vitals reviewed. Constitutional: She is oriented to person, place, and time. She appears well-developed and well-nourished.  HENT:  Head: Normocephalic and atraumatic.  Eyes: Conjunctivae and EOM are normal.  Neck: Neck supple.  Cardiovascular: Normal rate.   Pulmonary/Chest: Effort normal.  Musculoskeletal: Normal range of motion.  Left lateral foot tenderness to palpation. No obvious deformity or swelling noted.   Neurological: She is alert and oriented to person, place, and time. No cranial nerve deficit. Coordination normal.  Skin: Skin is warm and dry.  Psychiatric: She has a normal mood and affect. Her behavior is normal.    ED Course  Procedures (including critical care time) DIAGNOSTIC STUDIES: Oxygen Saturation is 98% on RA, normal by my interpretation.    COORDINATION OF CARE: 6:27 PM Discussed treatment plan with pt at bedside and pt agreed to plan.  Labs Review  Labs Reviewed - No data to display  Imaging Review Dg Foot Complete Left  06/28/2013   CLINICAL DATA:  Sudden onset left foot pain No injury.  No trauma.  EXAM: LEFT FOOT - COMPLETE 3+ VIEW  COMPARISON:  None.  FINDINGS: Mild first MTP joint osteoarthritis is present. There is no fracture. The soft tissues appear within normal limits. The alignment is anatomic.  IMPRESSION: Negative.   Electronically Signed   By: Dereck Ligas M.D.   On: 06/28/2013 18:14    EKG Interpretation None      MDM   Final diagnoses:  Left foot pain    6:29 PM Patient's  xray unremarkable for acute changes. Patient will have naprosyn for pain. Patient will not have narcotic prescription due to pain management contract. Vitals stable and patient afebrile. No neurovascular compromise.   I personally performed the services described in this documentation, which was scribed in my presence. The recorded information has been reviewed and is accurate.     Alvina Chou, PA-C 06/28/13 1831

## 2013-06-28 NOTE — ED Provider Notes (Signed)
Medical screening examination/treatment/procedure(s) were performed by non-physician practitioner and as supervising physician I was immediately available for consultation/collaboration.   EKG Interpretation None      Rolland Porter, MD, Abram Sander   Janice Norrie, MD 06/28/13 [phone]

## 2013-06-28 NOTE — ED Notes (Signed)
Pt c/o left foot pain that started yesterday, states it on the outer side by ankle. Denies injury

## 2013-10-10 ENCOUNTER — Emergency Department (HOSPITAL_COMMUNITY): Payer: Medicaid Other

## 2013-10-10 ENCOUNTER — Emergency Department (HOSPITAL_COMMUNITY)
Admission: EM | Admit: 2013-10-10 | Discharge: 2013-10-10 | Disposition: A | Discharge status: Home / Self Care | Payer: Medicaid Other | Attending: Emergency Medicine | Admitting: Emergency Medicine

## 2013-10-10 ENCOUNTER — Encounter (HOSPITAL_COMMUNITY): Payer: Self-pay | Admitting: Emergency Medicine

## 2013-10-10 DIAGNOSIS — X503XXA Overexertion from repetitive movements, initial encounter: Secondary | ICD-10-CM | POA: Diagnosis not present

## 2013-10-10 DIAGNOSIS — Z791 Long term (current) use of non-steroidal anti-inflammatories (NSAID): Secondary | ICD-10-CM | POA: Insufficient documentation

## 2013-10-10 DIAGNOSIS — F319 Bipolar disorder, unspecified: Secondary | ICD-10-CM | POA: Insufficient documentation

## 2013-10-10 DIAGNOSIS — S239XXA Sprain of unspecified parts of thorax, initial encounter: Secondary | ICD-10-CM | POA: Insufficient documentation

## 2013-10-10 DIAGNOSIS — R053 Chronic cough: Secondary | ICD-10-CM

## 2013-10-10 DIAGNOSIS — IMO0002 Reserved for concepts with insufficient information to code with codable children: Secondary | ICD-10-CM | POA: Insufficient documentation

## 2013-10-10 DIAGNOSIS — Z7982 Long term (current) use of aspirin: Secondary | ICD-10-CM | POA: Insufficient documentation

## 2013-10-10 DIAGNOSIS — R059 Cough, unspecified: Secondary | ICD-10-CM | POA: Insufficient documentation

## 2013-10-10 DIAGNOSIS — Z79899 Other long term (current) drug therapy: Secondary | ICD-10-CM | POA: Insufficient documentation

## 2013-10-10 DIAGNOSIS — F17200 Nicotine dependence, unspecified, uncomplicated: Secondary | ICD-10-CM | POA: Insufficient documentation

## 2013-10-10 DIAGNOSIS — Y9389 Activity, other specified: Secondary | ICD-10-CM | POA: Insufficient documentation

## 2013-10-10 DIAGNOSIS — R062 Wheezing: Secondary | ICD-10-CM | POA: Diagnosis not present

## 2013-10-10 DIAGNOSIS — R05 Cough: Secondary | ICD-10-CM | POA: Insufficient documentation

## 2013-10-10 DIAGNOSIS — Y9289 Other specified places as the place of occurrence of the external cause: Secondary | ICD-10-CM | POA: Diagnosis not present

## 2013-10-10 DIAGNOSIS — I10 Essential (primary) hypertension: Secondary | ICD-10-CM | POA: Insufficient documentation

## 2013-10-10 DIAGNOSIS — S39012A Strain of muscle, fascia and tendon of lower back, initial encounter: Secondary | ICD-10-CM

## 2013-10-10 MED ORDER — ONDANSETRON 4 MG PO TBDP
4.0000 mg | ORAL_TABLET | Freq: Once | ORAL | Status: AC
  Administered 2013-10-10: 4 mg via ORAL
  Filled 2013-10-10: qty 1

## 2013-10-10 MED ORDER — NAPROXEN 375 MG PO TABS: 375.0000 mg | ORAL_TABLET | Freq: Two times a day (BID) | ORAL | Status: DC

## 2013-10-10 MED ORDER — ALBUTEROL SULFATE HFA 108 (90 BASE) MCG/ACT IN AERS
2.0000 | INHALATION_SPRAY | RESPIRATORY_TRACT | Status: DC | PRN
  Filled 2013-10-10: qty 6.7

## 2013-10-10 MED ORDER — TRAMADOL HCL 50 MG PO TABS
50.0000 mg | ORAL_TABLET | Freq: Once | ORAL | Status: AC
  Administered 2013-10-10: 50 mg via ORAL
  Filled 2013-10-10: qty 1

## 2013-10-10 MED ORDER — CYCLOBENZAPRINE HCL 10 MG PO TABS: 10.0000 mg | ORAL_TABLET | Freq: Two times a day (BID) | ORAL | Status: DC | PRN

## 2013-10-10 MED ORDER — CYCLOBENZAPRINE HCL 10 MG PO TABS
5.0000 mg | ORAL_TABLET | Freq: Once | ORAL | Status: AC
  Administered 2013-10-10: 5 mg via ORAL
  Filled 2013-10-10: qty 1

## 2013-10-10 NOTE — Discharge Instructions (Signed)
Cough, Adult  A cough is a reflex that helps clear your throat and airways. It can help heal the body or may be a reaction to an irritated airway. A cough may only last 2 or 3 weeks (acute) or may last more than 8 weeks (chronic).  CAUSES Acute cough:  Viral or bacterial infections. Chronic cough:  Infections.  Allergies.  Asthma.  Post-nasal drip.  Smoking.  Heartburn or acid reflux.  Some medicines.  Chronic lung problems (COPD).  Cancer. SYMPTOMS   Cough.  Fever.  Chest pain.  Increased breathing rate.  High-pitched whistling sound when breathing (wheezing).  Colored mucus that you cough up (sputum). TREATMENT   A bacterial cough may be treated with antibiotic medicine.  A viral cough must run its course and will not respond to antibiotics.  Your caregiver may recommend other treatments if you have a chronic cough. HOME CARE INSTRUCTIONS   Only take over-the-counter or prescription medicines for pain, discomfort, or fever as directed by your caregiver. Use cough suppressants only as directed by your caregiver.  Use a cold steam vaporizer or humidifier in your bedroom or home to help loosen secretions.  Sleep in a semi-upright position if your cough is worse at night.  Rest as needed.  Stop smoking if you smoke. SEEK IMMEDIATE MEDICAL CARE IF:   You have pus in your sputum.  Your cough starts to worsen.  You cannot control your cough with suppressants and are losing sleep.  You begin coughing up blood.  You have difficulty breathing.  You develop pain which is getting worse or is uncontrolled with medicine.  You have a fever. MAKE SURE YOU:   Understand these instructions.  Will watch your condition.  Will get help right away if you are not doing well or get worse. Document Released: 06/27/2010 Document Revised: 03/23/2011 Document Reviewed: 06/27/2010 [facility] Patient Information 2015 [place], [place]. This information is not intended  to replace advice given to you by your health care provider. Make sure you discuss any questions you have with your health care provider.  Back Pain, Adult Low back pain is very common. About 1 in 5 people have back pain.The cause of low back pain is rarely dangerous. The pain often gets better over time.About half of people with a sudden onset of back pain feel better in just 2 weeks. About 8 in 10 people feel better by 6 weeks.  CAUSES Some common causes of back pain include:  Strain of the muscles or ligaments supporting the spine.  Wear and tear (degeneration) of the spinal discs.  Arthritis.  Direct injury to the back. DIAGNOSIS Most of the time, the direct cause of low back pain is not known.However, back pain can be treated effectively even when the exact cause of the pain is unknown.Answering your caregiver's questions about your overall health and symptoms is one of the most accurate ways to make sure the cause of your pain is not dangerous. If your caregiver needs more information, he or she may order lab work or imaging tests (X-rays or MRIs).However, even if imaging tests show changes in your back, this usually does not require surgery. HOME CARE INSTRUCTIONS For many people, back pain returns.Since low back pain is rarely dangerous, it is often a condition that people can learn to [facility] their own.   Remain active. It is stressful on the back to sit or stand in one place. Do not sit, drive, or stand in one place for more than 30  minutes at a time. Take short walks on level surfaces as soon as pain allows.Try to increase the length of time you walk each day.  Do not stay in bed.Resting more than 1 or 2 days can delay your recovery.  Do not avoid exercise or work.Your body is made to move.It is not dangerous to be active, even though your back may hurt.Your back will likely heal faster if you return to being active before your pain is gone.  Pay attention to your  body when you bend and lift. Many people have less discomfortwhen lifting if they bend their knees, keep the load close to their bodies,and avoid twisting. Often, the most comfortable positions are those that put less stress on your recovering back.  Find a comfortable position to sleep. Use a firm mattress and lie on your side with your knees slightly bent. If you lie on your back, put a pillow under your knees.  Only take over-the-counter or prescription medicines as directed by your caregiver. Over-the-counter medicines to reduce pain and inflammation are often the most helpful.Your caregiver may prescribe muscle relaxant drugs.These medicines help dull your pain so you can more quickly return to your normal activities and healthy exercise.  Put ice on the injured area.  Put ice in a plastic bag.  Place a towel between your skin and the bag.  Leave the ice on for 15-20 minutes, 03-04 times a day for the first 2 to 3 days. After that, ice and heat may be alternated to reduce pain and spasms.  Ask your caregiver about trying back exercises and gentle massage. This may be of some benefit.  Avoid feeling anxious or stressed.Stress increases muscle tension and can worsen back pain.It is important to recognize when you are anxious or stressed and learn ways to manage it.Exercise is a great option. SEEK MEDICAL CARE IF:  You have pain that is not relieved with rest or medicine.  You have pain that does not improve in 1 week.  You have new symptoms.  You are generally not feeling well. SEEK IMMEDIATE MEDICAL CARE IF:   You have pain that radiates from your back into your legs.  You develop new bowel or bladder control problems.  You have unusual weakness or numbness in your arms or legs.  You develop nausea or vomiting.  You develop abdominal pain.  You feel faint. Document Released: 12/29/2004 Document Revised: 06/30/2011 Document Reviewed: 05/02/2013 [facility] Patient  Information 2015 [place], [place]. This information is not intended to replace advice given to you by your health care provider. Make sure you discuss any questions you have with your health care provider.

## 2013-10-10 NOTE — ED Notes (Signed)
Pt asked for sandwich because she is afraid the meds will make her nauseated. Sandwich given with ice water.

## 2013-10-10 NOTE — ED Notes (Signed)
Pt believes the Tramadol has made her nauseated and this has been added to her allergies.

## 2013-10-10 NOTE — ED Notes (Signed)
Patient reports that she has had a non productive cough x 6 months and now has mid back pain that is worse with movement and breathing. Patient denies any heavy lifting, falling, or injury.

## 2013-10-10 NOTE — ED Notes (Signed)
Pt taken to xray 

## 2013-10-10 NOTE — ED Provider Notes (Signed)
CSN: [id number]     Arrival date & time 10/10/13  0912 History   First MD Initiated Contact with Patient 10/10/13 [phone]     Chief Complaint  Patient presents with  . Back Pain  . Cough     (Consider location/radiation/quality/duration/timing/severity/associated sxs/prior Treatment) HPI  Patient to the ED with complaints of coughing for over the past year and now over the past 3 days right scapular pain. She has been smoke since she was 48 years old. Has a family history of various cancers. She abuses alcohol, cocaine and is bipolar. She has hypertension and depression. She describes the cough has chronic, dry, and sometimes productive. Has not had fevers, weight loss, chills, chest pains, mid back pain, fatigue, appetite change. Reports otherwise feeling healthy.  Past Medical History  Diagnosis Date  . Depression   . Hypertension   . Bipolar affective   . Cocaine abuse   . Alcohol abuse    Past Surgical History  Procedure Laterality Date  . Cesarean section    . Knee surgery     Family History  Problem Relation Age of Onset  . Family history unknown: Yes   History  Substance Use Topics  . Smoking status: Current Every Day Smoker -- 0.50 packs/day    Types: Cigarettes  . Smokeless tobacco: Never Used  . Alcohol Use: Yes   OB History   Grav Para Term Preterm Abortions TAB SAB Ect Mult Living                 Review of Systems   Review of Systems  Gen: no weight loss, fevers, chills, night sweats  Eyes: no occular draining, occular pain,  No visual changes  Nose: no epistaxis or rhinorrhea  Mouth: no dental pain, no sore throat  Neck: no neck pain  Lungs: No hemoptysis. No wheezing + coughing and wheezing CV:  No palpitations, dependent edema or orthopnea. No chest pain Abd: no diarrhea. No nausea or vomiting, No abdominal pain  GU: no dysuria or gross hematuria  MSK:  No muscle weakness, + muscular pain- right shoulder/ scapula Neuro: no headache, no focal  neurologic deficits  Skin: no rash , no wounds Psyche: no complaints of depression or anxiety    Allergies  Tramadol  Home Medications   Prior to Admission medications   Medication Sig Start Date End Date Taking? Authorizing Provider  ARIPiprazole (ABILIFY) 10 MG tablet Take 10 mg by mouth daily.   Yes Historical Provider, MD  Aspirin-Acetaminophen-Caffeine (GOODYS EXTRA STRENGTH PO) Take 1 packet by mouth daily.   Yes Historical Provider, MD  lisinopril (PRINIVIL,ZESTRIL) 20 MG tablet Take 20 mg by mouth daily.   Yes Historical Provider, MD  naproxen (NAPROSYN) 500 MG tablet Take 1 tablet (500 mg total) by mouth 2 (two) times daily with a meal. 06/28/13  Yes Kaitlyn Szekalski, PA-C  traZODone (DESYREL) 100 MG tablet Take 100 mg by mouth at bedtime.   Yes Historical Provider, MD  cyclobenzaprine (FLEXERIL) 10 MG tablet Take 1 tablet (10 mg total) by mouth 2 (two) times daily as needed for muscle spasms. 10/10/13   Shaolin Armas Marilu Favre, PA-C  naproxen (NAPROSYN) 375 MG tablet Take 1 tablet (375 mg total) by mouth 2 (two) times daily. 10/10/13   Kamela Blansett Marilu Favre, PA-C   BP 138/97  Pulse 102  Temp(Src) 98.3 F (36.8 C) (Oral)  Resp 16  SpO2 97%  LMP 08/19/2012 Physical Exam  Nursing note and vitals reviewed. Constitutional: She appears well-developed  and well-nourished. No distress.  HENT:  Head: Normocephalic and atraumatic.  Eyes: Pupils are equal, round, and reactive to light.  Neck: Normal range of motion. Neck supple.  Cardiovascular: Normal rate and regular rhythm.   Pulmonary/Chest: Effort normal. She has wheezes. She has rhonchi.  Coughing during exam  Abdominal: Soft.  Musculoskeletal:       Thoracic back: She exhibits tenderness, pain and spasm. She exhibits normal range of motion, no bony tenderness, no swelling, no edema, no deformity, no laceration and normal pulse.       Back:  Pain with palpation of right scapula. Pain with ROM of right shoulder. NO midline tenderness.  No weakness to her bilateral upper extremities.  Neurological: She is alert.  Skin: Skin is warm and dry.    ED Course  Procedures (including critical care time) Labs Review Labs Reviewed - No data to display  Imaging Review Dg Chest 2 View  10/10/2013   CLINICAL DATA:  48 year old female with chronic cough. Pain on the right radiating to the scapula. Initial encounter. Substance abuse.  EXAM: CHEST  2 VIEW  COMPARISON:  06/02/2010 and earlier.  FINDINGS: Mild eventration of the diaphragm is stable. Lung volumes are stable and within normal limits. Normal cardiac size and mediastinal contours. Visualized tracheal air column is within normal limits. No pneumothorax, pulmonary edema, pleural effusion or confluent pulmonary opacity. Stable mild increased interstitial markings diffusely. No osseous abnormality identified.  IMPRESSION: No acute cardiopulmonary abnormality.   Electronically Signed   By: Lars Pinks M.D.   On: 10/10/2013 11:09     EKG Interpretation None      MDM   Final diagnoses:  Chronic cough  Back strain, initial encounter   48 y.o.Monique Fowler  with cough and scapular pain to the left.  No fever, night sweats, weight loss, h/o cancer, IVDU.  Xray did not show any fluid of lungs, mass, lesions. She is not SOB or having CP.   RICE protocol and pain medicine indicated and discussed with patient.   Patient Plan 1. Medications: pain medication and muscle relaxer. Cont usual home medications unless otherwise directed. 2. Treatment: rest, drink plenty of fluids, gentle stretching as discussed, alternate ice and heat  3. Follow Up: Please followup with your primary doctor for discussion of your diagnoses and further evaluation after today's visit; if you do not have a primary care doctor use the resource guide provided to find one   Vital signs are stable at discharge. Filed Vitals:   10/10/13 0925  BP: 138/97  Pulse: 102  Temp: 98.3 F (36.8 C)  Resp: 16     Patient/guardian has voiced understanding and agreed to follow-up with the PCP or specialist.         Linus Mako, PA-C 10/10/13 1122

## 2013-10-11 NOTE — ED Provider Notes (Signed)
Medical screening examination/treatment/procedure(s) were performed by non-physician practitioner and as supervising physician I was immediately available for consultation/collaboration.   EKG Interpretation None        Evelina Bucy, MD 10/11/13 [phone]

## 2014-05-04 NOTE — Consult Note (Signed)
PATIENT NAME:  Monique Fowler, MCMULLEN MR#:  [id number] DATE OF BIRTH:  03-24-1965  DATE OF CONSULTATION:  07/07/2012  CONSULTING PHYSICIAN:  Trisha Morandi S. Gretel Acre, MD  REASON FOR CONSULTATION: Having suicidal ideation related to the desire to get help.   HISTORY OF PRESENT ILLNESS: The patient is a 49 year old African American female who has been using alcohol and cocaine on a daily basis and presented to the ED after she was experiencing suicidal ideation related to desire to get help. The patient was evaluated in the ED. She reported that she has been feeling depressed and was having suicidal ideations. She wanted to kill herself as she was feeling very depressed. She reported that she was crying and was feeling sad. She also stated that she was having auditory hallucinations, and they were talking to her. The patient mentioned about using crack cocaine every day since she was like 49 years old. She reported that she was clean for four years in the past when she went to a rehabilitation program in [place]; however, she stated that she since she has returned to [place], she has increased her drug use. She also has been drinking approximately 2-40 ounces of beer on a daily basis. The patient reported that she has not used marijuana recently. The patient reported that  [facility] the main area where she has increased her use of drugs. She reported that she has suicidal thoughts of running in front of the car, as she has history of suicide attempts in the past. She stated that she was recently discharged from the [facility] 3 weeks ago, but she feels that the current psychotropic medications are not helping her, so she decided to come here to seek help. She stated that she wants to go to a rehabilitation program to get some help. She stated that she is not experiencing any thoughts to harm herself at this time. She is more interested in going to a rehabilitation program.   PAST PSYCHIATRIC HISTORY: The  patient reported that she has attempted suicide at least 3 times in the past by overdosing on the drugs. She reported that she was admitted at least 4 times to a psychiatric inpatient hospital. She was hospitalized at [facility] three weeks ago for suicidal ideation with OD related to substance abuse. She reported that she was prescribed lamotrigine, but it is not helpful. And she has been to 2 rehabilitation programs in the past including one in [place],  and after she remains sober for at least 4 years. She reported that she also went to [facility] in the past.   PAST MEDICAL HISTORY:  1.  Hypertension.  2.  Uncontrolled pain, which is chronic.   CURRENT MEDICATIONS: Metoprolol and medications for mood stabilization.   ALLERGIES: No known drug allergies.   FAMILY HISTORY: She reported a history of depression and in her sister, as there is drug use in her family members.   SOCIAL HISTORY: The patient reported that she lives in transition house for the past three months. She reported that she will be able to return the same place at this time.   ANCILLARY:   VITAL SIGNS: Temperature 99.3, pulse 111, respirations 20, blood pressure 114/75.   LABORATORY DATA: Glucose 120, BUN 8, creatinine 0.66, sodium 140, potassium 3.3, chloride 109, bicarbonate 22, anion gap 7, osmolality 279, calcium 8.7. Blood alcohol level 55, and protein 7.1, albumin 3.6, bilirubin 0.2, alkaline phosphatase is 78, AST 8, ALT 13. TSH 1.4. Urine drug  screen negative. WBC 9.9, RBC is 4.95, hemoglobin 13.5, hematocrit 40.0, platelet count 242, MCV 81, RDW 15.6   REVIEW OF SYSTEMS:  CONSTITUTIONAL:  No fever or chills. No weight changes.  EYES: No double or blurred vision RESPIRATORY:  No shortness of breath or cough.  CARDIOVASCULAR: No chest pain or orthopnea.  GASTROINTESTINAL: No abdominal pain, nausea, vomiting, diarrhea.  GENITOURINARY: No incontinence or frequency.  ENDOCRINE: No heat or cold  intolerance.  LYMPHATIC: No anemia or easy bruising.  INTEGUMENTARY: No acne or rash.  MUSCULOSKELETAL: Denies any muscle or joint pain.   MENTAL STATUS EXAMINATION: The patient is a moderately built [place] American female who appeared her stated age. She was somewhat agitated and belligerent, as she reported that lying on the bed hurts her back. She was asking for medication to help with her mood also. Her speech was somewhat rapid. Affect was congruent. Thought process tangential. Thought content was non-delusional. She expressed that is not having any suicidal ideations at this time. She was able to contract for safety.   DIAGNOSTIC IMPRESSION: AXIS I:  1.  Mood disorder, not otherwise specified.  2.  Polysubstance dependence.   AXIS II: None.   AXIS III: Hypertension.   TREATMENT PLAN: 1.  The patient will be referred to [facility] for help with her use of drugs including alcohol, cocaine and marijuana and the patient agreed with the plan.  2.  She will be started on Seroquel 100 mg p.o. at bedtime for mood stabilization and will be continued on lamotrigine 25 mg p.o. b.i.d.  3.  She will be also given Librium 25 mg p.o. t.i.d. x 2 days for alcohol withdrawal symptoms.  4.  She will be transferred to [facility] once a bed becomes available.   Thank you for allowing me to participate in the care of this patient.   ____________________________ Cordelia Pen. Gretel Acre, MD usf:cc D: 07/07/2012 13:47:08 ET T: 07/07/2012 14:09:32 ET JOB#: [id number]  cc: Cordelia Pen. Gretel Acre, MD, <Dictator> Jeronimo Norma MD ELECTRONICALLY SIGNED 07/11/2012 13:23

## 2014-05-17 ENCOUNTER — Encounter (HOSPITAL_COMMUNITY): Payer: Self-pay | Admitting: Emergency Medicine

## 2014-05-17 ENCOUNTER — Emergency Department (HOSPITAL_COMMUNITY)
Admission: EM | Admit: 2014-05-17 | Discharge: 2014-05-17 | Disposition: A | Discharge status: Home / Self Care | Payer: Medicaid Other | Attending: Emergency Medicine | Admitting: Emergency Medicine

## 2014-05-17 DIAGNOSIS — I10 Essential (primary) hypertension: Secondary | ICD-10-CM | POA: Insufficient documentation

## 2014-05-17 DIAGNOSIS — Z79899 Other long term (current) drug therapy: Secondary | ICD-10-CM | POA: Insufficient documentation

## 2014-05-17 DIAGNOSIS — F319 Bipolar disorder, unspecified: Secondary | ICD-10-CM | POA: Insufficient documentation

## 2014-05-17 DIAGNOSIS — R1110 Vomiting, unspecified: Secondary | ICD-10-CM | POA: Insufficient documentation

## 2014-05-17 DIAGNOSIS — J020 Streptococcal pharyngitis: Secondary | ICD-10-CM | POA: Diagnosis not present

## 2014-05-17 DIAGNOSIS — J029 Acute pharyngitis, unspecified: Secondary | ICD-10-CM | POA: Diagnosis present

## 2014-05-17 DIAGNOSIS — Z791 Long term (current) use of non-steroidal anti-inflammatories (NSAID): Secondary | ICD-10-CM | POA: Insufficient documentation

## 2014-05-17 DIAGNOSIS — Z720 Tobacco use: Secondary | ICD-10-CM | POA: Diagnosis not present

## 2014-05-17 LAB — RAPID STREP SCREEN (MED CTR MEBANE ONLY): STREPTOCOCCUS, GROUP A SCREEN (DIRECT): POSITIVE — AB

## 2014-05-17 MED ORDER — DEXAMETHASONE SODIUM PHOSPHATE 10 MG/ML IJ SOLN
10.0000 mg | Freq: Once | INTRAMUSCULAR | Status: AC
  Administered 2014-05-17: 10 mg via INTRAMUSCULAR
  Filled 2014-05-17: qty 1

## 2014-05-17 MED ORDER — IBUPROFEN 800 MG PO TABS
800.0000 mg | ORAL_TABLET | Freq: Once | ORAL | Status: AC
  Administered 2014-05-17: 800 mg via ORAL
  Filled 2014-05-17: qty 1

## 2014-05-17 MED ORDER — PENICILLIN G BENZATHINE 1200000 UNIT/2ML IM SUSP
1.2000 10*6.[IU] | Freq: Once | INTRAMUSCULAR | Status: AC
  Administered 2014-05-17: 1.2 10*6.[IU] via INTRAMUSCULAR
  Filled 2014-05-17: qty 2

## 2014-05-17 MED ORDER — ONDANSETRON 4 MG PO TBDP: 4.0000 mg | ORAL_TABLET | Freq: Three times a day (TID) | ORAL | Status: DC | PRN

## 2014-05-17 MED ORDER — ONDANSETRON 4 MG PO TBDP
4.0000 mg | ORAL_TABLET | Freq: Once | ORAL | Status: AC
  Administered 2014-05-17: 4 mg via ORAL
  Filled 2014-05-17: qty 1

## 2014-05-17 MED ORDER — IBUPROFEN 800 MG PO TABS: 800.0000 mg | ORAL_TABLET | Freq: Three times a day (TID) | ORAL | Status: DC | PRN

## 2014-05-17 NOTE — ED Provider Notes (Addendum)
TIME SEEN: 8:00 AM  CHIEF COMPLAINT: Sore throat, headache, vomiting  HPI: Pt is a 49 y.o. female with history of hypertension, cocaine and alcohol abuse who presents emergency department with sore throat, headache and intermittent vomiting. Sore throat and headache have been present for the past 3 days and vomiting started this morning. No diarrhea. No abdominal pain. No neck pain or neck stiffness. No known sick contacts or recent travel. Patient reports she has had strep throat multiple times in the past. No difficulty swallowing. She has had fever. Also reports mild cough.  ROS: See HPI Constitutional:  fever  Eyes: no drainage  ENT: no runny nose   Cardiovascular:  no chest pain  Resp: no SOB  GI: vomiting GU: no dysuria Integumentary: no rash  Allergy: no hives  Musculoskeletal: no leg swelling  Neurological: no slurred speech ROS otherwise negative  PAST MEDICAL HISTORY/PAST SURGICAL HISTORY:  Past Medical History  Diagnosis Date  . Depression   . Hypertension   . Bipolar affective   . Cocaine abuse   . Alcohol abuse     MEDICATIONS:  Prior to Admission medications   Medication Sig Start Date End Date Taking? Authorizing Provider  ARIPiprazole (ABILIFY) 10 MG tablet Take 10 mg by mouth daily.   Yes Historical Provider, MD  Aspirin-Acetaminophen 500-325 MG PACK Take 2 packets by mouth every 30 (thirty) minutes as needed (for pain).   Yes Historical Provider, MD  lisinopril (PRINIVIL,ZESTRIL) 20 MG tablet Take 20 mg by mouth daily.   Yes Historical Provider, MD  cyclobenzaprine (FLEXERIL) 10 MG tablet Take 1 tablet (10 mg total) by mouth 2 (two) times daily as needed for muscle spasms. Patient not taking: Reported on 05/17/2014 10/10/13   Delos Haring, PA-C  naproxen (NAPROSYN) 375 MG tablet Take 1 tablet (375 mg total) by mouth 2 (two) times daily. Patient not taking: Reported on 05/17/2014 10/10/13   Delos Haring, PA-C  naproxen (NAPROSYN) 500 MG tablet Take 1 tablet (500  mg total) by mouth 2 (two) times daily with a meal. Patient not taking: Reported on 05/17/2014 06/28/13   Alvina Chou, PA-C    ALLERGIES:  Allergies  Allergen Reactions  . Tramadol Nausea And Vomiting    SOCIAL HISTORY:  History  Substance Use Topics  . Smoking status: Current Every Day Smoker -- 0.50 packs/day    Types: Cigarettes  . Smokeless tobacco: Never Used  . Alcohol Use: Yes    FAMILY HISTORY: Family History  Problem Relation Age of Onset  . Family history unknown: Yes    EXAM: BP 136/87 mmHg  Pulse 110  Temp(Src) 100.8 F (38.2 C) (Oral)  Resp 20  SpO2 95%  LMP 08/13/2012 CONSTITUTIONAL: Alert and oriented and responds appropriately to questions. Well-appearing; well-nourished, nontoxic, smiling and making jokes HEAD: Normocephalic EYES: Conjunctivae clear, PERRL ENT: normal nose; no rhinorrhea; moist mucous membranes; bilateral tonsillar hypertrophy with diffuse exudates, no uvular deviation, no trismus or drooling, no sign of dental abscess, no Ludwig angina, patient does have a slightly muffled voice but is handling her secretions without difficulty and her airway is a an with no hypoxia or respiratory distress, no stridor NECK: Supple, no meningismus, no LAD  CARD: Regular and tachycardic; S1 and S2 appreciated; no murmurs, no clicks, no rubs, no gallops RESP: Normal chest excursion without splinting or tachypnea; breath sounds clear and equal bilaterally; no wheezes, no rhonchi, no rales, no hypoxia or respiratory distress, speaking full sentences ABD/GI: Normal bowel sounds; non-distended; soft, non-tender, no  rebound, no guarding, no peritoneal signs BACK:  The back appears normal and is non-tender to palpation, there is no CVA tenderness EXT: Normal ROM in all joints; non-tender to palpation; no edema; normal capillary refill; no cyanosis, no calf tenderness or swelling    SKIN: Normal color for age and race; warm, no rash NEURO: Moves all extremities  equally, sensation to light touch intact diffusely, cranial nerves II through XII intact PSYCH: The patient's mood and manner are appropriate. Grooming and personal hygiene are appropriate.  MEDICAL DECISION MAKING: Patient here with strep pharyngitis. She is febrile and mildly tachycardic. Does have slightly muffled voice but her airway is patent and she is handling her secretions without any difficulty. Will give dose of IM Decadron as well as IM penicillin. Will give ibuprofen for pain. Have recommended alternating Tylenol and ibuprofen at home. Discussed increasing fluid intake. Given her recurrent episodes of strep pharyngitis will refer to ENT. Discussed return precautions. She verbalized understanding and is comfortable with plan.       [place], DO 05/17/14 [place], DO 05/17/14 [id number]

## 2014-05-17 NOTE — ED Notes (Signed)
Pt c/o headache and sore throat x 3 days. Pt requesting to get her tonsils taken out due to having strep throat constantly.  Pt also states that she cant get any medicine that requires an ID bc she lost her purse.

## 2014-05-17 NOTE — Discharge Instructions (Signed)
You may alternate between Tylenol 1000 mg every 6 hours as needed for fever and pain and ibuprofen 800 mg every 8 hours as needed for fever and pain. If you have difficulty breathing, swallowing your own saliva, talking I recommend you return to the hospital. I also recommended you follow-up with ENT given your history of recurrent strep throat.   Strep Throat Strep throat is an infection of the throat caused by a bacteria named Streptococcus pyogenes. Your health care provider may call the infection streptococcal "tonsillitis" or "pharyngitis" depending on whether there are signs of inflammation in the tonsils or back of the throat. Strep throat is most common in children aged 5-15 years during the cold months of the year, but it can occur in people of any age during any season. This infection is spread from person to person (contagious) through coughing, sneezing, or other close contact. SIGNS AND SYMPTOMS   Fever or chills.  Painful, swollen, red tonsils or throat.  Pain or difficulty when swallowing.  White or yellow spots on the tonsils or throat.  Swollen, tender lymph nodes or "glands" of the neck or under the jaw.  Red rash all over the body (rare). DIAGNOSIS  Many different infections can cause the same symptoms. A test must be done to confirm the diagnosis so the right treatment can be given. A "rapid strep test" can help your health care provider make the diagnosis in a few minutes. If this test is not available, a light swab of the infected area can be used for a throat culture test. If a throat culture test is done, results are usually available in a day or two. TREATMENT  Strep throat is treated with antibiotic medicine. HOME CARE INSTRUCTIONS   Gargle with 1 tsp of salt in 1 cup of warm water, 3-4 times per day or as needed for comfort.  Family members who also have a sore throat or fever should be tested for strep throat and treated with antibiotics if they have the strep  infection.  Make sure everyone in your household washes their hands well.  Do not share food, drinking cups, or personal items that could cause the infection to spread to others.  You may need to eat a soft food diet until your sore throat gets better.  Drink enough water and fluids to keep your urine clear or pale yellow. This will help prevent dehydration.  Get plenty of rest.  Stay home from school, day care, or work until you have been on antibiotics for 24 hours.  Take medicines only as directed by your health care provider.  Take your antibiotic medicine as directed by your health care provider. Finish it even if you start to feel better. SEEK MEDICAL CARE IF:   The glands in your neck continue to enlarge.  You develop a rash, cough, or earache.  You cough up green, yellow-brown, or bloody sputum.  You have pain or discomfort not controlled by medicines.  Your problems seem to be getting worse rather than better.  You have a fever. SEEK IMMEDIATE MEDICAL CARE IF:   You develop any new symptoms such as vomiting, severe headache, stiff or painful neck, chest pain, shortness of breath, or trouble swallowing.  You develop severe throat pain, drooling, or changes in your voice.  You develop swelling of the neck, or the skin on the neck becomes red and tender.  You develop signs of dehydration, such as fatigue, dry mouth, and decreased urination.  You become  increasingly sleepy, or you cannot wake up completely. MAKE SURE YOU:  Understand these instructions.  Will watch your condition.  Will get help right away if you are not doing well or get worse. Document Released: 12/27/1999 Document Revised: 05/15/2013 Document Reviewed: 02/27/2010 [facility] Patient Information 2015 [place], [place]. This information is not intended to replace advice given to you by your health care provider. Make sure you discuss any questions you have with your health care provider.  Salt  Water Gargle This solution will help make your mouth and throat feel better. HOME CARE INSTRUCTIONS   Mix 1 teaspoon of salt in 8 ounces of warm water.  Gargle with this solution as much or often as you need or as directed. Swish and gargle gently if you have any sores or wounds in your mouth.  Do not swallow this mixture. Document Released: 10/03/2003 Document Revised: 03/23/2011 Document Reviewed: 02/24/2008 [facility] Patient Information 2015 [place], [place]. This information is not intended to replace advice given to you by your health care provider. Make sure you discuss any questions you have with your health care provider.

## 2014-07-08 ENCOUNTER — Emergency Department (HOSPITAL_COMMUNITY)
Admission: EM | Admit: 2014-07-08 | Discharge: 2014-07-09 | Disposition: A | Discharge status: Other Acute Inpatient Hospital | Payer: Medicaid Other | Attending: Emergency Medicine | Admitting: Emergency Medicine

## 2014-07-08 ENCOUNTER — Encounter (HOSPITAL_COMMUNITY): Payer: Self-pay

## 2014-07-08 DIAGNOSIS — F10929 Alcohol use, unspecified with intoxication, unspecified: Secondary | ICD-10-CM | POA: Insufficient documentation

## 2014-07-08 DIAGNOSIS — Z79899 Other long term (current) drug therapy: Secondary | ICD-10-CM | POA: Diagnosis not present

## 2014-07-08 DIAGNOSIS — I10 Essential (primary) hypertension: Secondary | ICD-10-CM | POA: Insufficient documentation

## 2014-07-08 DIAGNOSIS — F319 Bipolar disorder, unspecified: Secondary | ICD-10-CM | POA: Insufficient documentation

## 2014-07-08 DIAGNOSIS — Z720 Tobacco use: Secondary | ICD-10-CM | POA: Diagnosis not present

## 2014-07-08 DIAGNOSIS — Z789 Other specified health status: Secondary | ICD-10-CM

## 2014-07-08 DIAGNOSIS — F1490 Cocaine use, unspecified, uncomplicated: Secondary | ICD-10-CM | POA: Insufficient documentation

## 2014-07-08 DIAGNOSIS — R45851 Suicidal ideations: Secondary | ICD-10-CM | POA: Diagnosis present

## 2014-07-08 DIAGNOSIS — Z7289 Other problems related to lifestyle: Secondary | ICD-10-CM

## 2014-07-08 LAB — CBC
HCT: 44.4 % (ref 36.0–46.0)
Hemoglobin: 14.4 g/dL (ref 12.0–15.0)
MCH: 27.1 pg (ref 26.0–34.0)
MCHC: 32.4 g/dL (ref 30.0–36.0)
MCV: 83.5 fL (ref 78.0–100.0)
Platelets: 230 10*3/uL (ref 150–400)
RBC: 5.32 MIL/uL — ABNORMAL HIGH (ref 3.87–5.11)
RDW: 15.2 % (ref 11.5–15.5)
WBC: 7.2 10*3/uL (ref 4.0–10.5)

## 2014-07-08 LAB — COMPREHENSIVE METABOLIC PANEL
ALT: 12 U/L — ABNORMAL LOW (ref 14–54)
ANION GAP: 10 (ref 5–15)
AST: 17 U/L (ref 15–41)
Albumin: 4.2 g/dL (ref 3.5–5.0)
Alkaline Phosphatase: 65 U/L (ref 38–126)
BILIRUBIN TOTAL: 0.4 mg/dL (ref 0.3–1.2)
BUN: 9 mg/dL (ref 6–20)
CALCIUM: 9.5 mg/dL (ref 8.9–10.3)
CHLORIDE: 106 mmol/L (ref 101–111)
CO2: 26 mmol/L (ref 22–32)
Creatinine, Ser: 0.62 mg/dL (ref 0.44–1.00)
GFR calc non Af Amer: >60 mL/min (ref >60)
Glucose, Bld: 107 mg/dL — ABNORMAL HIGH (ref 65–99)
POTASSIUM: 4.1 mmol/L (ref 3.5–5.1)
Sodium: 142 mmol/L (ref 135–145)
Total Protein: 7.2 g/dL (ref 6.5–8.1)

## 2014-07-08 LAB — RAPID URINE DRUG SCREEN, HOSP PERFORMED
Amphetamines: NOT DETECTED
BARBITURATES: NOT DETECTED
Benzodiazepines: NOT DETECTED
Cocaine: POSITIVE — AB
OPIATES: NOT DETECTED
Tetrahydrocannabinol: NOT DETECTED

## 2014-07-08 LAB — ACETAMINOPHEN LEVEL: Acetaminophen (Tylenol), Serum: <10 ug/mL — ABNORMAL LOW (ref 10–30)

## 2014-07-08 LAB — SALICYLATE LEVEL: Salicylate Lvl: <4 mg/dL (ref 2.8–30.0)

## 2014-07-08 LAB — ETHANOL

## 2014-07-08 MED ORDER — NICOTINE 21 MG/24HR TD PT24: 21.0000 mg | MEDICATED_PATCH | Freq: Every day | TRANSDERMAL | Status: DC

## 2014-07-08 MED ORDER — LISINOPRIL 20 MG PO TABS
20.0000 mg | ORAL_TABLET | Freq: Every day | ORAL | Status: DC
  Administered 2014-07-08: 20 mg via ORAL
  Filled 2014-07-08 (×2): qty 1

## 2014-07-08 MED ORDER — ARIPIPRAZOLE 10 MG PO TABS
10.0000 mg | ORAL_TABLET | Freq: Every day | ORAL | Status: DC
  Administered 2014-07-08: 10 mg via ORAL
  Filled 2014-07-08 (×2): qty 1

## 2014-07-08 MED ORDER — ALUM & MAG HYDROXIDE-SIMETH 200-200-20 MG/5ML PO SUSP: 30.0000 mL | ORAL | Status: DC | PRN

## 2014-07-08 MED ORDER — ACETAMINOPHEN 325 MG PO TABS: 650.0000 mg | ORAL_TABLET | ORAL | Status: DC | PRN

## 2014-07-08 MED ORDER — IBUPROFEN 200 MG PO TABS: 600.0000 mg | ORAL_TABLET | Freq: Three times a day (TID) | ORAL | Status: DC | PRN

## 2014-07-08 MED ORDER — ZOLPIDEM TARTRATE 5 MG PO TABS: 5.0000 mg | ORAL_TABLET | Freq: Every evening | ORAL | Status: DC | PRN

## 2014-07-08 MED ORDER — LORAZEPAM 1 MG PO TABS: 1.0000 mg | ORAL_TABLET | Freq: Three times a day (TID) | ORAL | Status: DC | PRN

## 2014-07-08 MED ORDER — ONDANSETRON HCL 4 MG PO TABS: 4.0000 mg | ORAL_TABLET | Freq: Three times a day (TID) | ORAL | Status: DC | PRN

## 2014-07-08 NOTE — ED Notes (Signed)
Patient states she is suicidal and has no plan today. Patient also requesting detox from alcohol and crack cocaine. Patient states she last drank 6 40 ounces and had $00.00 worth of crack yesterday. Patient states she does this daily. Patient denies visual or auditory hallucinations.

## 2014-07-08 NOTE — ED Provider Notes (Signed)
CSN: [id number]     Arrival date & time 07/08/14  1721 History   First MD Initiated Contact with Patient 07/08/14 1834     Chief Complaint  Patient presents with  . Addiction Problem    Patient requesting detox.  . Suicidal    No plan     (Consider location/radiation/quality/duration/timing/severity/associated sxs/prior Treatment) HPI Comments: Monique Fowler is a 49 y.o. female with a PMHx of depression, HTN, bipolar, cocaine abuse, and alcohol abuse, who presents to the ED with complaints of suicidal ideations with a plan to stand in the [place] to let cars hit her, which she states she attempted yesterday but did not get hit. She also endorses alcohol use, drank three 40 ounce beers yesterday with the last being at 3 PM, as well as cocaine use $50 worth yesterday. She endorses some nausea but she is only eating a sandwich. She states that she has not been on her Abilify or antianxiety medication in 3 months. She has also been out of her blood pressure medication in 3 months. She does to [facility] for psychiatric care. She is a smoker, half pack per day. She denies any other medical complaints at this time.  Patient is a 49 y.o. female presenting with mental health disorder. The history is provided by the patient. No language interpreter was used.  Mental Health Problem Presenting symptoms: depression, suicidal thoughts and suicide attempt   Presenting symptoms: no hallucinations and no homicidal ideas   Onset quality:  Gradual Duration:  1 day Timing:  Constant Progression:  Unchanged Chronicity:  Recurrent Context: alcohol use, drug abuse and noncompliance   Treatment compliance:  Untreated Time since last psychoactive medication taken:  3 months Relieved by:  None tried Worsened by:  Alcohol and drugs Ineffective treatments:  None tried Associated symptoms: no abdominal pain, no chest pain and no headaches   Risk factors: hx of mental illness     Past Medical History  Diagnosis  Date  . Depression   . Hypertension   . Bipolar affective   . Cocaine abuse   . Alcohol abuse    Past Surgical History  Procedure Laterality Date  . Cesarean section    . Knee surgery     Family History  Problem Relation Age of Onset  . Family history unknown: Yes   History  Substance Use Topics  . Smoking status: Current Every Day Smoker -- 0.50 packs/day    Types: Cigarettes  . Smokeless tobacco: Never Used  . Alcohol Use: Yes     Comment: 5-6 40 ounce beers daily   OB History    No data available     Review of Systems  Constitutional: Negative for fever and chills.  Eyes: Negative for visual disturbance.  Respiratory: Negative for shortness of breath.   Cardiovascular: Negative for chest pain.  Gastrointestinal: Positive for nausea. Negative for vomiting, abdominal pain, diarrhea and constipation.  Genitourinary: Negative for dysuria, hematuria, vaginal bleeding and vaginal discharge.  Musculoskeletal: Negative for myalgias and arthralgias.  Skin: Negative for color change.  Allergic/Immunologic: Negative for immunocompromised state.  Neurological: Negative for weakness, numbness and headaches.  Psychiatric/Behavioral: Positive for suicidal ideas. Negative for homicidal ideas, hallucinations and confusion.   10 Systems reviewed and are negative for acute change except as noted in the HPI.    Allergies  Tramadol  Home Medications   Prior to Admission medications   Medication Sig Start Date End Date Taking? Authorizing Provider  ARIPiprazole (ABILIFY) 10 MG tablet  Take 10 mg by mouth daily.   Yes Historical Provider, MD  Aspirin-Acetaminophen 500-325 MG PACK Take 2 packets by mouth every 30 (thirty) minutes as needed (for pain).   Yes Historical Provider, MD  lisinopril (PRINIVIL,ZESTRIL) 20 MG tablet Take 20 mg by mouth daily.   Yes Historical Provider, MD  ondansetron (ZOFRAN ODT) 4 MG disintegrating tablet Take 1 tablet (4 mg total) by mouth every 8 (eight)  hours as needed for nausea or vomiting. 05/17/14  Yes Kristen N Ward, DO  cyclobenzaprine (FLEXERIL) 10 MG tablet Take 1 tablet (10 mg total) by mouth 2 (two) times daily as needed for muscle spasms. Patient not taking: Reported on 05/17/2014 10/10/13   Delos Haring, PA-C  ibuprofen (ADVIL,MOTRIN) 800 MG tablet Take 1 tablet (800 mg total) by mouth every 8 (eight) hours as needed for mild pain. Patient not taking: Reported on 07/08/2014 05/17/14   Delice Bison Ward, DO  naproxen (NAPROSYN) 375 MG tablet Take 1 tablet (375 mg total) by mouth 2 (two) times daily. Patient not taking: Reported on 05/17/2014 10/10/13   Delos Haring, PA-C  naproxen (NAPROSYN) 500 MG tablet Take 1 tablet (500 mg total) by mouth 2 (two) times daily with a meal. Patient not taking: Reported on 05/17/2014 06/28/13   Kaitlyn Szekalski, PA-C   BP 157/101 mmHg  Pulse 85  Temp(Src) 98.4 F (36.9 C) (Oral)  Resp 16  SpO2 100%  LMP 08/13/2012 Physical Exam  Constitutional: She is oriented to person, place, and time. Vital signs are normal. She appears well-developed and well-nourished.  Non-toxic appearance. No distress.  Afebrile, nontoxic, NAD. Very mildly hypertensive. Eating during exam  HENT:  Head: Normocephalic and atraumatic.  Mouth/Throat: Oropharynx is clear and moist and mucous membranes are normal.  Eyes: Conjunctivae and EOM are normal. Right eye exhibits no discharge. Left eye exhibits no discharge.  Neck: Normal range of motion. Neck supple.  Cardiovascular: Normal rate, regular rhythm, normal heart sounds and intact distal pulses.  Exam reveals no gallop and no friction rub.   No murmur heard. Pulmonary/Chest: Effort normal and breath sounds normal. No respiratory distress. She has no decreased breath sounds. She has no wheezes. She has no rhonchi. She has no rales.  Abdominal: Soft. Normal appearance and bowel sounds are normal. She exhibits no distension. There is no tenderness. There is no rigidity, no rebound, no  guarding, no CVA tenderness, no tenderness at McBurney's point and negative Murphy's sign.  Musculoskeletal: Normal range of motion.  Neurological: She is alert and oriented to person, place, and time. She has normal strength. No sensory deficit.  Skin: Skin is warm, dry and intact. No rash noted.  Psychiatric: She is not actively hallucinating. She exhibits a depressed mood. She expresses suicidal ideation. She expresses no homicidal ideation. She expresses suicidal plans. She expresses no homicidal plans.  Depressed mood, endorsing SI without plan. Denies AVH/HI.  Nursing note and vitals reviewed.   ED Course  Procedures (including critical care time) Labs Review Labs Reviewed  ACETAMINOPHEN LEVEL - Abnormal; Notable for the following:    Acetaminophen (Tylenol), Serum <10 (*)    All other components within normal limits  CBC - Abnormal; Notable for the following:    RBC 5.32 (*)    All other components within normal limits  COMPREHENSIVE METABOLIC PANEL - Abnormal; Notable for the following:    Glucose, Bld 107 (*)    ALT 12 (*)    All other components within normal limits  URINE RAPID DRUG  SCREEN, HOSP PERFORMED - Abnormal; Notable for the following:    Cocaine POSITIVE (*)    All other components within normal limits  ETHANOL  SALICYLATE LEVEL    Imaging Review No results found.   EKG Interpretation None      MDM   Final diagnoses:  Suicidal ideations  Alcohol use  Cocaine use    49 y.o. female here with SI with plan of standing out in the [place] to let cars hit her, states she did it yesterday but didn't get hit. Also with EtOH use and cocaine use. Complains of nausea but eating in the room during exam. No medical complaints. BP mildly elevated but without s/sx of HTN, will simply restart home BP meds but doubt need for further work up. Will consult TTS. Pt here voluntarily but if she was to attempt to leave, she would need to be IVC'd.  8:10 PM Labs  unremarkable aside from UDS showing +cocaine. Medically cleared. Please see [facility] notes for further documentation of care.  BP 157/101 mmHg  Pulse 85  Temp(Src) 98.4 F (36.9 C) (Oral)  Resp 16  SpO2 100%  LMP 08/13/2012  Meds ordered this encounter  Medications  . alum & mag hydroxide-simeth (MAALOX/MYLANTA) 200-200-20 MG/5ML suspension 30 mL    Sig:   . ondansetron (ZOFRAN) tablet 4 mg    Sig:   . nicotine (NICODERM CQ - dosed in mg/24 hours) patch 21 mg    Sig:   . zolpidem (AMBIEN) tablet 5 mg    Sig:   . ibuprofen (ADVIL,MOTRIN) tablet 600 mg    Sig:   . acetaminophen (TYLENOL) tablet 650 mg    Sig:   . LORazepam (ATIVAN) tablet 1 mg    Sig:   . ARIPiprazole (ABILIFY) tablet 10 mg    Sig:   . lisinopril (PRINIVIL,ZESTRIL) tablet 20 mg    Sig:      Willson Lipa Camprubi-Soms, PA-C 07/08/14 2013  Pamella Pert, MD 07/10/14 [phone]
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
## 2014-07-08 NOTE — ED Notes (Signed)
Pt presents with complaint of SI.  Pt reports she walked into the street in front of traffic yesterday.  Denies HI or AV hallucinations. AAO x 3, no distress noted, calm & cooperative.  Reports history of Bipolar DO, Depression, SI and stress.  Pt states she uses crack cocaine and drinks alcohol all day.  Monitoring for safety, Q 15 min checks in effect.

## 2014-07-08 NOTE — BH Assessment (Signed)
Received notification of TTS consult request. Spoke to [employer], PA-C who said Pt is abusing substances and suicidal. Tele-assessment will be initiated.  Orpah Greek Anson Fret, [place], [facility], [facility] Triage Specialist [phone]

## 2014-07-08 NOTE — ED Notes (Signed)
Attempted to call report to [facility] but will call back in about 5-10 minutes for possible report.

## 2014-07-09 ENCOUNTER — Inpatient Hospital Stay (HOSPITAL_COMMUNITY)
Admission: AD | Admit: 2014-07-09 | Discharge: 2014-07-11 | DRG: 885 | Disposition: A | Discharge status: Home / Self Care | Payer: Medicaid Other | Source: Intra-hospital | Attending: Psychiatry | Admitting: Psychiatry

## 2014-07-09 ENCOUNTER — Encounter (HOSPITAL_COMMUNITY): Payer: Self-pay | Admitting: *Deleted

## 2014-07-09 DIAGNOSIS — K219 Gastro-esophageal reflux disease without esophagitis: Secondary | ICD-10-CM | POA: Diagnosis present

## 2014-07-09 DIAGNOSIS — F1994 Other psychoactive substance use, unspecified with psychoactive substance-induced mood disorder: Secondary | ICD-10-CM | POA: Diagnosis not present

## 2014-07-09 DIAGNOSIS — I10 Essential (primary) hypertension: Secondary | ICD-10-CM | POA: Diagnosis present

## 2014-07-09 DIAGNOSIS — R45851 Suicidal ideations: Secondary | ICD-10-CM | POA: Diagnosis present

## 2014-07-09 DIAGNOSIS — F17210 Nicotine dependence, cigarettes, uncomplicated: Secondary | ICD-10-CM | POA: Diagnosis present

## 2014-07-09 DIAGNOSIS — F410 Panic disorder [episodic paroxysmal anxiety] without agoraphobia: Secondary | ICD-10-CM | POA: Diagnosis present

## 2014-07-09 DIAGNOSIS — F319 Bipolar disorder, unspecified: Principal | ICD-10-CM | POA: Diagnosis present

## 2014-07-09 DIAGNOSIS — F10230 Alcohol dependence with withdrawal, uncomplicated: Secondary | ICD-10-CM | POA: Diagnosis not present

## 2014-07-09 DIAGNOSIS — G4700 Insomnia, unspecified: Secondary | ICD-10-CM | POA: Diagnosis present

## 2014-07-09 DIAGNOSIS — F1020 Alcohol dependence, uncomplicated: Secondary | ICD-10-CM | POA: Diagnosis present

## 2014-07-09 DIAGNOSIS — F1424 Cocaine dependence with cocaine-induced mood disorder: Secondary | ICD-10-CM

## 2014-07-09 DIAGNOSIS — F3130 Bipolar disorder, current episode depressed, mild or moderate severity, unspecified: Secondary | ICD-10-CM

## 2014-07-09 MED ORDER — MIRTAZAPINE 30 MG PO TABS
30.0000 mg | ORAL_TABLET | Freq: Every day | ORAL | Status: DC
  Administered 2014-07-09 – 2014-07-10 (×2): 30 mg via ORAL
  Filled 2014-07-09 (×3): qty 1
  Filled 2014-07-09: qty 3

## 2014-07-09 MED ORDER — ACETAMINOPHEN 325 MG PO TABS: 650.0000 mg | ORAL_TABLET | Freq: Four times a day (QID) | ORAL | Status: DC | PRN

## 2014-07-09 MED ORDER — ARIPIPRAZOLE 10 MG PO TABS
10.0000 mg | ORAL_TABLET | Freq: Every day | ORAL | Status: DC
  Administered 2014-07-09 – 2014-07-11 (×3): 10 mg via ORAL
  Filled 2014-07-09 (×5): qty 1
  Filled 2014-07-09: qty 3

## 2014-07-09 MED ORDER — LISINOPRIL 20 MG PO TABS
20.0000 mg | ORAL_TABLET | Freq: Every day | ORAL | Status: DC
  Administered 2014-07-09 – 2014-07-11 (×3): 20 mg via ORAL
  Filled 2014-07-09 (×4): qty 1
  Filled 2014-07-09: qty 3
  Filled 2014-07-09: qty 1

## 2014-07-09 MED ORDER — LORAZEPAM 1 MG PO TABS
1.0000 mg | ORAL_TABLET | Freq: Four times a day (QID) | ORAL | Status: AC
  Administered 2014-07-09 (×2): 1 mg via ORAL
  Filled 2014-07-09 (×2): qty 1

## 2014-07-09 MED ORDER — HYDROXYZINE HCL 25 MG PO TABS: 25.0000 mg | ORAL_TABLET | Freq: Four times a day (QID) | ORAL | Status: DC | PRN

## 2014-07-09 MED ORDER — LORAZEPAM 1 MG PO TABS
1.0000 mg | ORAL_TABLET | Freq: Once | ORAL | Status: AC
  Administered 2014-07-09: 1 mg via ORAL
  Filled 2014-07-09: qty 1

## 2014-07-09 MED ORDER — ADULT MULTIVITAMIN W/MINERALS CH
1.0000 | ORAL_TABLET | Freq: Every day | ORAL | Status: DC
  Administered 2014-07-09 – 2014-07-11 (×3): 1 via ORAL
  Filled 2014-07-09: qty 3
  Filled 2014-07-09 (×4): qty 1

## 2014-07-09 MED ORDER — LORAZEPAM 1 MG PO TABS
1.0000 mg | ORAL_TABLET | Freq: Three times a day (TID) | ORAL | Status: AC
  Administered 2014-07-10 (×3): 1 mg via ORAL
  Filled 2014-07-09 (×3): qty 1

## 2014-07-09 MED ORDER — LOPERAMIDE HCL 2 MG PO CAPS
4.0000 mg | ORAL_CAPSULE | Freq: Once | ORAL | Status: AC
  Administered 2014-07-09: 4 mg via ORAL
  Filled 2014-07-09 (×2): qty 2

## 2014-07-09 MED ORDER — LORAZEPAM 1 MG PO TABS: 1.0000 mg | ORAL_TABLET | Freq: Every day | ORAL | Status: DC

## 2014-07-09 MED ORDER — LORAZEPAM 1 MG PO TABS
1.0000 mg | ORAL_TABLET | Freq: Two times a day (BID) | ORAL | Status: DC
  Administered 2014-07-11: 1 mg via ORAL
  Filled 2014-07-09: qty 1

## 2014-07-09 MED ORDER — LORAZEPAM 1 MG PO TABS: 1.0000 mg | ORAL_TABLET | Freq: Four times a day (QID) | ORAL | Status: DC | PRN

## 2014-07-09 MED ORDER — TRAZODONE HCL 50 MG PO TABS: 50.0000 mg | ORAL_TABLET | Freq: Every evening | ORAL | Status: DC | PRN

## 2014-07-09 MED ORDER — ONDANSETRON 4 MG PO TBDP
4.0000 mg | ORAL_TABLET | Freq: Four times a day (QID) | ORAL | Status: DC | PRN
  Administered 2014-07-09: 4 mg via ORAL
  Filled 2014-07-09: qty 1

## 2014-07-09 MED ORDER — MAGNESIUM HYDROXIDE 400 MG/5ML PO SUSP: 30.0000 mL | Freq: Every day | ORAL | Status: DC | PRN

## 2014-07-09 MED ORDER — LOPERAMIDE HCL 2 MG PO CAPS
2.0000 mg | ORAL_CAPSULE | ORAL | Status: DC | PRN
  Administered 2014-07-09 (×2): 2 mg via ORAL
  Filled 2014-07-09 (×2): qty 1

## 2014-07-09 MED ORDER — THIAMINE HCL 100 MG/ML IJ SOLN: 100.0000 mg | Freq: Once | INTRAMUSCULAR | Status: DC

## 2014-07-09 MED ORDER — ALUM & MAG HYDROXIDE-SIMETH 200-200-20 MG/5ML PO SUSP
30.0000 mL | ORAL | Status: DC | PRN
  Administered 2014-07-09: 30 mL via ORAL
  Filled 2014-07-09: qty 30

## 2014-07-09 MED ORDER — VITAMIN B-1 100 MG PO TABS
100.0000 mg | ORAL_TABLET | Freq: Every day | ORAL | Status: DC
  Administered 2014-07-10 – 2014-07-11 (×2): 100 mg via ORAL
  Filled 2014-07-09 (×4): qty 1
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
## 2014-07-09 NOTE — Progress Notes (Signed)
Recreation Therapy Notes  Date: 06.27.16 Time: 9:30 am Location: [address] Dayroom  Group Topic: Stress Management  Goal Area(s) Addresses:  Patient will verbalize importance of using healthy stress management.  Patient will identify positive emotions associated with healthy stress management.   Behavioral Response: Engaged  Intervention: Stress Management  Activity :  Guided Automotive engineer.  LRT introduced and educated patients on stress management technique of guided imagery.  A script was used to to deliver the technique to patients.  Patients were asked to follow script read aloud by LRT to engage in the stress management technique.  Education:  Stress Management, Discharge Planning.   Education Outcome: Acknowledges edcuation/In group clarification offered/Needs additional education  Clinical Observations/Feedback: Patient attended group.  Victorino Sparrow , LRT/CTRS         Victorino Sparrow A 07/09/2014 5:02 PM

## 2014-07-09 NOTE — Progress Notes (Signed)
Patient ID: Monique Fowler, female   DOB: [birth year], 49 y.o.   MRN: [id number]  DAR: Pt. Denies HI and A/V Hallucinations. Patient endorses SI with no current plan but her thoughts are on and off. Patient is able to contract for safety. Support and encouragement provided to the patient. Patient reports her sleep last night was poor, appetite is fair, energy level is low, and concentration level is poor. Patient reports depression and anxiety 7/10 and hopelessness 8/10. Patient continues to report moderate withdrawal symptoms however patient reports feeling better as the day goes on. Scheduled medications administered to patient per physician's orders. Patient reported nausea and diarrhea related to withdrawal. Patient received Imodium as necessary and Zofran. Patient reported relief after these were administered. Patient is receptive and cooperative. Q15 minute checks are maintained for safety.

## 2014-07-09 NOTE — Progress Notes (Signed)
Patient in day at the begining of the shift interacting with peers. She reported that her day was up and down; rated her depression at 8 om a scale of 1-10 with 10 the worst. She endorsed passive SI but contracted for safety. She denied withdrawals. She also reported that she didn't sleep last night. Writer told patient that she would check med profile and if nothing ordered for sleep for her she would notify the PA  And request for sleep med for her. Q 15 minute check continues as ordered to maintains safety.

## 2014-07-09 NOTE — [facility] Suicide Risk Assessment (Signed)
[facility] Admission Suicide Risk Assessment   Nursing information obtained from:    Demographic factors:    Current Mental Status:    Loss Factors:    Historical Factors:    Risk Reduction Factors:    Total Time spent with patient: 45 minutes Principal Problem: Substance induced mood disorder Diagnosis:   Patient Active Problem List   Diagnosis Date Noted  . Substance induced mood disorder [F19.94] 07/09/2014  . Alcohol dependence [F10.20] 07/09/2014  . Cocaine dependence with cocaine-induced mood disorder [F14.24] 07/09/2014  . Bipolar I disorder, most recent episode depressed [F31.30] 07/09/2014  . Fibroids [D25.9] 08/12/2010  . Dysmenorrhea [N94.6] 08/12/2010  . Adenomyosis [N80.0] 08/12/2010  . Menorrhagia [N92.0] 08/12/2010  . Smoker [Z72.0] 08/12/2010  . GERD (gastroesophageal reflux disease) [K21.9] 08/12/2010  . Depression [F32.9] 08/12/2010     Continued Clinical Symptoms:  Alcohol Use Disorder Identification Test Final Score (AUDIT): 28 The "Alcohol Use Disorders Identification Test", Guidelines for Use in Primary Care, Second Edition.  World Pharmacologist [facility]). Score between 0-7:  no or low risk or alcohol related problems. Score between 8-15:  moderate risk of alcohol related problems. Score between 16-19:  high risk of alcohol related problems. Score 20 or above:  warrants further diagnostic evaluation for alcohol dependence and treatment.   CLINICAL FACTORS:   Bipolar Disorder:   Bipolar II Alcohol/Substance Abuse/Dependencies  Psychiatric Specialty Exam: Physical Exam  ROS  Blood pressure 135/81, pulse 68, temperature 97.6 F (36.4 C), temperature source Oral, resp. rate 20, height 5' 5.5" (1.664 m), weight 88.451 kg (195 lb), last menstrual period 08/13/2012.Body mass index is 31.94 kg/(m^2).    COGNITIVE FEATURES THAT CONTRIBUTE TO RISK:  Closed-mindedness, Polarized thinking and Thought constriction (tunnel vision)    SUICIDE RISK:   Moderate:   Frequent suicidal ideation with limited intensity, and duration, some specificity in terms of plans, no associated intent, good self-control, limited dysphoria/symptomatology, some risk factors present, and identifiable protective factors, including available and accessible social support.  PLAN OF CARE: Supportive approach/coping skills                               Alcohol cocaine dependence; detox with Ativan/work a relapse prevention plan                      Mood instability; will resume the Abilify 10 mg daily                               Explore residential treatment options  Medical Decision Making:  Review of Psycho-Social Stressors (1), Review or order clinical lab tests (1), Review of Medication Regimen & Side Effects (2) and Review of New Medication or Change in Dosage (2)  I certify that inpatient services furnished can reasonably be expected to improve the patient's condition.   Kofi Murrell A 07/09/2014, 6:57 PM
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
## 2014-07-09 NOTE — BHH Group Notes (Signed)
   [facility] LCSW Aftercare Discharge Planning Group Note  07/09/2014  8:45 AM   Participation Quality: Alert, Appropriate and Oriented  Mood/Affect: Depressed and Flat  Depression Rating: 8  Anxiety Rating: Reports high anxiety levels today  Thoughts of Suicide: Pt denies SI/HI  Will you contract for safety? Yes  Current AVH: Pt denies  Plan for Discharge/Comments: Pt attended discharge planning group and actively participated in group. CSW provided pt with today's workbook. Patient reports feeling depressed today. She plans to discharge to a recovery house- reports that she can move in on Friday.  Transportation Means: Pt reports access to transportation  Supports: No supports mentioned at this time  Tilden Fossa, MSW, [place] Social Worker [employer] [phone]

## 2014-07-09 NOTE — Progress Notes (Signed)
Pt was pleasant and cooperative during the adm process. Per report pt "walked into traffic". Pt admit to abusing crack and etoh. Stated,   "when I do it, I do it all day".  Stated that she "only drinks when she does crack". Stated it's usually 3 times weekly. Pt asked the writer about the average length of stay. When informed it was 3 to 5 days, pt informed the writer that she already has a place to go for rehab. Stated she'll be going to a halfway house on [address] [phone]. Stated the bed will be held til Friday. Pt denies SI, HI, A/V during the adm process.

## 2014-07-09 NOTE — Plan of Care (Signed)
Problem: Alteration in mood Goal: STG-Patient reports thoughts of self-harm to staff Outcome: Progressing Patient able to report SI to this Probation officer. Patient is able to contract for safety.

## 2014-07-09 NOTE — Tx Team (Addendum)
Initial Interdisciplinary Treatment Plan   PATIENT STRESSORS: Financial difficulties Medication change or noncompliance Substance abuse   PATIENT STRENGTHS: Ability for insight Active sense of humor Average or above average intelligence Capable of independent living Communication skills General fund of knowledge Motivation for treatment/growth Physical Health Religious Affiliation   PROBLEM LIST: Problem List/Patient Goals Date to be addressed Date deferred Reason deferred Estimated date of resolution  "Drugs" 07/09/14     "Depression" 07/09/14     "I wanna get these suicidal thoughts out my head" 07/09/14           Depression 07/09/14     Increased risk for suicide 07/09/14     Substance abuse 07/09/14     HTN             DISCHARGE CRITERIA:  Ability to meet basic life and health needs Adequate post-discharge living arrangements Improved stabilization in mood, thinking, and/or behavior Medical problems require only outpatient monitoring Motivation to continue treatment in a less acute level of care Need for constant or close observation no longer present Reduction of life-threatening or endangering symptoms to within safe limits Safe-care adequate arrangements made Verbal commitment to aftercare and medication compliance Withdrawal symptoms are absent or subacute and managed without 24-hour nursing intervention  PRELIMINARY DISCHARGE PLAN: Attend aftercare/continuing care group Attend 12-step recovery group Outpatient therapy Placement in alternative living arrangements  PATIENT/FAMIILY INVOLVEMENT: This treatment plan has been presented to and reviewed with the patient, Monique Fowler, and/or family member.  The patient and family have been given the opportunity to ask questions and make suggestions.  Monique Fowler 07/09/2014, 2:13 AM

## 2014-07-09 NOTE — ED Notes (Signed)
Report called to RN Maudie Mercury, [facility].  Pending transport after 1am.

## 2014-07-10 MED ORDER — NICOTINE POLACRILEX 2 MG MT GUM
2.0000 mg | CHEWING_GUM | OROMUCOSAL | Status: DC | PRN
  Administered 2014-07-10: 2 mg via ORAL
  Filled 2014-07-10: qty 1

## 2014-07-10 NOTE — BHH Group Notes (Signed)
[facility] Group Therapy 07/10/2014 1:15pm  Type of Therapy: [facility] Presentation  Pt came to group briefly but left and did not return.   Peri Maris, LCSWA 07/10/2014 1:38 PM

## 2014-07-10 NOTE — Progress Notes (Signed)
Pt was invited to attend the [place] speaker meeting tonight. Pt was in and out of group and eventually stayed out.

## 2014-07-10 NOTE — Progress Notes (Signed)
Recreation Therapy Notes  Animal-Assisted Activity (AAA) Program Checklist/Progress Notes Patient Eligibility Criteria Checklist & Daily Group note for Rec Tx Intervention  Date: 06.28.16 Time: 2:45 pm Location: [address]  AAA/T Program Assumption of Risk Form signed by Patient/ or Parent Legal Guardian yes  Patient is free of allergies or sever asthma yes  Patient reports no fear of animals yes  Patient reports no history of cruelty to animalsyes  Patient understands his/her participation is voluntary yes  Patient washes hands before animal contact yes  Patient washes hands after animal contact yes  Education: Hand Washing, Appropriate Animal Interaction   Clinical Observations/Feedback: Patient did not attend group.   Victorino Sparrow, LRT/CTRS         Victorino Sparrow A 07/10/2014 4:06 PM
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
## 2014-07-10 NOTE — BHH Group Notes (Signed)
Adult Psychoeducational Group Note  Date:  07/10/2014 Time:  0900am  Group Topic/Focus:  Goals Group:   The focus of this group is to help patients establish daily goals to achieve during treatment and discuss how the patient can incorporate goal setting into their daily lives to aide in recovery. Orientation:   The focus of this group is to educate the patient on the purpose and policies of crisis stabilization and provide a format to answer questions about their admission.  The group details unit policies and expectations of patients while admitted.  Participation Level:  Minimal  Participation Quality:  Drowsy  Affect:  Depressed and Flat  Cognitive:  Appropriate  Insight: Limited  Engagement in Group:  Poor  Modes of Intervention:  Discussion, Education and Orientation   Wynn Banker 07/10/2014, 12:59 PM

## 2014-07-10 NOTE — Progress Notes (Signed)
D:  Per pt self inventory pt reports sleeping good, appetite fair, energy level low, ability to pay attention poor, rates depression at an 8 out of 10, hopelessness at a 10 out of 10, anxiety at an 8 out of 10, denies SI/HI/AVH, goal today;  "get better, stay focused", flat/sad during interaction.  A:  Emotional support provided, Encouraged pt to continue with treatment plan and attend all group activities, q15 min checks maintained for safety.  R:  Pt is receptive, going to groups, pleasant and cooperative with staff and other patients on the unit.

## 2014-07-10 NOTE — Tx Team (Signed)
Interdisciplinary Treatment Plan Update (Adult) Date: 07/10/2014   Date: 07/10/2014 1:11 PM  Progress in Treatment:  Attending groups: Yes  Participating in groups: Yes  Taking medication as prescribed: Yes  Tolerating medication: Yes  Family/Significant othe contact made: No, CSW attempting to make contact with brother Patient understands diagnosis: Yes AEB her willingness to seek treatment Discussing patient identified problems/goals with staff: Yes  Medical problems stabilized or resolved: Yes  Denies suicidal/homicidal ideation: No, endorses passive SI but contracts for safety Patient has not harmed self or Others: Yes   New problem(s) identified: None identified at this time.   Discharge Plan or Barriers: CSW making appropriate referrals to residential substance abuse treatment. Referrals to be made to [facility] and [facility]  Additional comments:  Patient is a 49 year old African American female admitted with SI with plan to walk into traffic and crack cocaine and ETOH abuse. Patient reports limited social supports but identifies her brother who is in recovery as supportive. She plans to discharge to [facility] and will follow up with outpatient providers.   Reason for Continuation of Hospitalization:  Anxiety Depression Medication stabilization Suicidal ideation  Estimated length of stay: 3-5 days  For review of initial/current patient goals, please see plan of care.   Attendees:  Patient:    Family:    Physician: Dr. Sabra Heck MD  07/10/2014 1:11 PM  Nursing: Lars Pinks, RN Case manager  07/10/2014 1:11 PM  Clinical Social Worker Norman Clay, MSW 07/10/2014 1:11 PM  Other: Lucinda Dell, Beverly Sessions Liasion 07/10/2014 1:11 PM  Clinical:  Kirkland Hun, RN 07/10/2014 1:11 PM  Other: , RN Charge Nurse 07/10/2014 1:11 PM  Other:     Peri Maris, [place] MSW

## 2014-07-11 DIAGNOSIS — F10230 Alcohol dependence with withdrawal, uncomplicated: Secondary | ICD-10-CM | POA: Insufficient documentation

## 2014-07-11 MED ORDER — ADULT MULTIVITAMIN W/MINERALS CH: 1.0000 | ORAL_TABLET | Freq: Every day | ORAL | Status: DC

## 2014-07-11 MED ORDER — HYDROXYZINE HCL 25 MG PO TABS: 25.0000 mg | ORAL_TABLET | Freq: Three times a day (TID) | ORAL | Status: DC | PRN

## 2014-07-11 MED ORDER — NICOTINE 21 MG/24HR TD PT24: 21.0000 mg | MEDICATED_PATCH | Freq: Every day | TRANSDERMAL | Status: DC

## 2014-07-11 MED ORDER — HYDROXYZINE HCL 25 MG PO TABS
25.0000 mg | ORAL_TABLET | Freq: Three times a day (TID) | ORAL | Status: DC | PRN
Start: 2014-07-11 — End: 2014-07-11
  Filled 2014-07-11: qty 6

## 2014-07-11 MED ORDER — LISINOPRIL 20 MG PO TABS: 20.0000 mg | ORAL_TABLET | Freq: Every day | ORAL | Status: DC

## 2014-07-11 MED ORDER — MIRTAZAPINE 30 MG PO TABS: 30.0000 mg | ORAL_TABLET | Freq: Every day | ORAL | Status: DC

## 2014-07-11 MED ORDER — NICOTINE 21 MG/24HR TD PT24
21.0000 mg | MEDICATED_PATCH | Freq: Every day | TRANSDERMAL | Status: DC
  Filled 2014-07-11: qty 14
  Filled 2014-07-11 (×2): qty 1

## 2014-07-11 MED ORDER — ARIPIPRAZOLE 10 MG PO TABS: 10.0000 mg | ORAL_TABLET | Freq: Every day | ORAL | Status: DC

## 2014-07-11 NOTE — Plan of Care (Signed)
Problem: Diagnosis: Increased Risk For Suicide Attempt Goal: STG-Patient Will Comply With Medication Regime Outcome: Progressing Patient has been compliant with medication regimen.

## 2014-07-11 NOTE — Progress Notes (Signed)
Recreation Therapy Notes  Date: 06.29.16 Time: 9:30 am Location: [address]  Group Topic: Stress Management  Goal Area(s) Addresses:  Patient will verbalize importance of using healthy stress management.  Patient will identify positive emotions associated with healthy stress management.   Behavioral Response: Engaged  Intervention: Stress Management  Activity : Progressive Muscle Relaxation. LRT introduced and educated patients on stress management technique of progressive muscle relaxation. A script was used to to deliver the technique to patients. Patients were asked to follow script read aloud by LRT to engage in the stress management technique.  Education: Stress Management, Discharge Planning.   Education Outcome: Acknowledges edcuation/In group clarification offered/Needs additional education  Clinical Observations/Feedback: Patient attended group.  Victorino Sparrow , LRT/CTRS        Ria Comment, Lalani Winkles A 07/11/2014 3:10 PM
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
## 2014-07-11 NOTE — Progress Notes (Signed)
  [facility] Adult Case Management Discharge Plan :  Will you be returning to the same living situation after discharge:  No. Pt is going to [employer] in [facility] At discharge, do you have transportation home?: Yes,  Pt provided with bus fare and PART fare Do you have the ability to pay for your medications: Yes,  Pt provided with supply and prescriptions  Release of information consent forms completed and in the chart;  Patient's signature needed at discharge.  Patient to Follow up at: Follow-up Information    [facility] .   Why:  Walk-in clinic on Tuesdays between 9 am to 12 pm for assessment for therapy and medication management services.     Contact information:   [address] [place]  [phone]      Follow up with [facility].   Why:  Please call the office to schedule a follow-up appointment.   Contact information:   [address] [place], [place] [address] Phone: [phone]       Patient denies SI/HI: Yes,  Pt denies    Safety Planning and Suicide Prevention discussed: Yes,  with Pt. See SPE note for further details   Have you used any form of tobacco in the last 30 days? (Cigarettes, Smokeless Tobacco, Cigars, and/or Pipes): Yes  Has patient been referred to the Quitline?: Patient refused referral  Bo Mcclintock 07/11/2014, 3:54 PM
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
## 2014-07-11 NOTE — Plan of Care (Signed)
Problem: Ineffective individual coping Goal: STG: Patient will remain free from self harm Outcome: Progressing Patient has remained free from self harm.

## 2014-07-11 NOTE — BHH Suicide Risk Assessment (Signed)
[facility] INPATIENT:  Family/Significant Other Suicide Prevention Education  Suicide Prevention Education:    CSW called to speak with Pt's brother Enid Derry per request of the Pt. When CSW attempted to complete SPE, Enid Derry reported that he would be leaving out of town for a month and would not be here. SPE was completed with Pt prior to DC.  Peri Maris, [place]
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
## 2014-07-11 NOTE — Progress Notes (Signed)
Discharge note: Patient discharged per MD order. Discharge summary reviewed with pt. Pt verbally expresses understanding of discharge information and signed discharge packet. Medication regimen reviewed with patient. Medication samples provided as well as RX. Patient verbally expresses understanding of mediation regimen. Patient denies SI/HI at discharge. Denies physical pain. 4$ cash and bus ticket provided to patient. All items returned to patient from locker. Patient verbally reports and signed that she received all items. Ambulatory out of facility to bus station.

## 2014-07-11 NOTE — Plan of Care (Signed)
Problem: Diagnosis: Increased Risk For Suicide Attempt Goal: LTG-Patient Will Report Improved Mood and Deny Suicidal LTG (by discharge) Patient will report improved mood and deny suicidal ideation.  Outcome: Progressing Patient denies suicidal ideation.
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
## 2014-09-03 ENCOUNTER — Encounter (HOSPITAL_COMMUNITY): Payer: Self-pay | Admitting: Family Medicine

## 2014-09-03 ENCOUNTER — Emergency Department (HOSPITAL_COMMUNITY)
Admission: EM | Admit: 2014-09-03 | Discharge: 2014-09-03 | Discharge status: Against Medical Advice | Payer: Medicaid Other | Attending: Emergency Medicine | Admitting: Emergency Medicine

## 2014-09-03 DIAGNOSIS — Z720 Tobacco use: Secondary | ICD-10-CM | POA: Insufficient documentation

## 2014-09-03 DIAGNOSIS — R42 Dizziness and giddiness: Secondary | ICD-10-CM | POA: Insufficient documentation

## 2014-09-03 DIAGNOSIS — R109 Unspecified abdominal pain: Secondary | ICD-10-CM | POA: Diagnosis not present

## 2014-09-03 DIAGNOSIS — I10 Essential (primary) hypertension: Secondary | ICD-10-CM | POA: Diagnosis not present

## 2014-09-03 DIAGNOSIS — R5383 Other fatigue: Secondary | ICD-10-CM | POA: Diagnosis not present

## 2014-09-03 LAB — COMPREHENSIVE METABOLIC PANEL
ALT: 11 U/L — AB (ref 14–54)
AST: 12 U/L — ABNORMAL LOW (ref 15–41)
Albumin: 4.4 g/dL (ref 3.5–5.0)
Alkaline Phosphatase: 90 U/L (ref 38–126)
Anion gap: 8 (ref 5–15)
BILIRUBIN TOTAL: 0.3 mg/dL (ref 0.3–1.2)
BUN: 18 mg/dL (ref 6–20)
CO2: 27 mmol/L (ref 22–32)
CREATININE: 0.76 mg/dL (ref 0.44–1.00)
Calcium: 9.9 mg/dL (ref 8.9–10.3)
Chloride: 100 mmol/L — ABNORMAL LOW (ref 101–111)
GFR calc Af Amer: >60 mL/min (ref >60)
Glucose, Bld: 126 mg/dL — ABNORMAL HIGH (ref 65–99)
Potassium: 4 mmol/L (ref 3.5–5.1)
Sodium: 135 mmol/L (ref 135–145)
TOTAL PROTEIN: 7.9 g/dL (ref 6.5–8.1)

## 2014-09-03 LAB — URINALYSIS, ROUTINE W REFLEX MICROSCOPIC
BILIRUBIN URINE: NEGATIVE
Glucose, UA: NEGATIVE mg/dL
KETONES UR: NEGATIVE mg/dL
Nitrite: NEGATIVE
Protein, ur: NEGATIVE mg/dL
Specific Gravity, Urine: 1.016 (ref 1.005–1.030)
UROBILINOGEN UA: 0.2 mg/dL (ref 0.0–1.0)
pH: 5.5 (ref 5.0–8.0)

## 2014-09-03 LAB — URINE MICROSCOPIC-ADD ON

## 2014-09-03 LAB — CBC
HCT: 45.8 % (ref 36.0–46.0)
Hemoglobin: 14.9 g/dL (ref 12.0–15.0)
MCH: 26.4 pg (ref 26.0–34.0)
MCHC: 32.5 g/dL (ref 30.0–36.0)
MCV: 81.2 fL (ref 78.0–100.0)
PLATELETS: 306 10*3/uL (ref 150–400)
RBC: 5.64 MIL/uL — ABNORMAL HIGH (ref 3.87–5.11)
RDW: 14.8 % (ref 11.5–15.5)
WBC: 11.2 10*3/uL — AB (ref 4.0–10.5)

## 2014-09-03 LAB — POC URINE PREG, ED: PREG TEST UR: NEGATIVE

## 2014-09-03 LAB — LIPASE, BLOOD: Lipase: 19 U/L — ABNORMAL LOW (ref 22–51)

## 2014-09-03 NOTE — ED Notes (Signed)
Pt is complaining of generalized weakness with dizziness and diffuse abd pain. Symptoms started about 2 weeks ago. Pt states she took [employer].

## 2014-10-07 ENCOUNTER — Emergency Department (HOSPITAL_COMMUNITY)
Admission: EM | Admit: 2014-10-07 | Discharge: 2014-10-07 | Discharge status: Against Medical Advice | Payer: Medicaid Other | Attending: Emergency Medicine | Admitting: Emergency Medicine

## 2014-10-07 ENCOUNTER — Encounter (HOSPITAL_COMMUNITY): Payer: Self-pay | Admitting: Emergency Medicine

## 2014-10-07 DIAGNOSIS — F1410 Cocaine abuse, uncomplicated: Secondary | ICD-10-CM | POA: Diagnosis not present

## 2014-10-07 DIAGNOSIS — I10 Essential (primary) hypertension: Secondary | ICD-10-CM | POA: Diagnosis not present

## 2014-10-07 DIAGNOSIS — Z720 Tobacco use: Secondary | ICD-10-CM | POA: Insufficient documentation

## 2014-10-07 DIAGNOSIS — F10120 Alcohol abuse with intoxication, uncomplicated: Secondary | ICD-10-CM | POA: Insufficient documentation

## 2014-10-07 NOTE — ED Notes (Addendum)
Registration at bedside and patient wakes up and says "oh it's a sista". Pt then beings to act as if she was going to vomit, pt drops emesis bag and this RN picks it up and hands it her as she is heaving. Pt then beings to yell at this RN and states "no, you are going to get me a fresh one". Pt never vomits. Pt yelling and cursing at staff. She states she wants to leave. Security called to bedside. Patient not wanting this RN to be at bedside. She is refusing further care. Pt stating to RN Vicente Males " you white ass bitch". Registration at bedside and patient wakes up and says "oh it's a sista".

## 2014-10-07 NOTE — ED Notes (Signed)
Pt ambulating in hall. Security and off duty [employer] at bedside. Pt ambulating to front

## 2014-10-07 NOTE — ED Notes (Signed)
Pt has cursed staffing entire time she has been here,  She states she don't want this white bitch taking care of her,  She demanded to leave and have her IV out,  Pt very uncooperative and demanding.  Pt became enraged when I ask her about the white powder under her nose possibly being cocaine she said "bitch"  I don't snort cocaine I smoke crack cocaine.  Pt offered dry clothes to put on but she refused .  Pt ripped off cardiac leads and hopped off end of bed nearly falling but didn't because staff, [employer] and security was able to assist pt back to bed, but pt jumped right back up and said she was getting the hell out of here.  Pt walked out with [employer] and security per her request to leave premises because she said all [place] white people were prejudice

## 2014-10-07 NOTE — ED Notes (Signed)
Pillow provided to patient she states " its about time".
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
## 2014-10-07 NOTE — ED Notes (Signed)
Pt not wanting her temperature taken. Pt was made aware of the need for a temperature.

## 2014-10-07 NOTE — ED Notes (Signed)
Pt easily agitated.

## 2015-09-05 ENCOUNTER — Emergency Department (HOSPITAL_COMMUNITY)
Admission: EM | Admit: 2015-09-05 | Discharge: 2015-09-05 | Disposition: A | Discharge status: Home / Self Care | Payer: Medicaid Other | Attending: Emergency Medicine | Admitting: Emergency Medicine

## 2015-09-05 ENCOUNTER — Emergency Department (HOSPITAL_COMMUNITY): Payer: Medicaid Other

## 2015-09-05 ENCOUNTER — Encounter (HOSPITAL_COMMUNITY): Payer: Self-pay | Admitting: Emergency Medicine

## 2015-09-05 DIAGNOSIS — I10 Essential (primary) hypertension: Secondary | ICD-10-CM | POA: Insufficient documentation

## 2015-09-05 DIAGNOSIS — Z79899 Other long term (current) drug therapy: Secondary | ICD-10-CM | POA: Diagnosis not present

## 2015-09-05 DIAGNOSIS — K2970 Gastritis, unspecified, without bleeding: Secondary | ICD-10-CM | POA: Diagnosis not present

## 2015-09-05 DIAGNOSIS — T368X5A Adverse effect of other systemic antibiotics, initial encounter: Secondary | ICD-10-CM | POA: Insufficient documentation

## 2015-09-05 DIAGNOSIS — R1011 Right upper quadrant pain: Secondary | ICD-10-CM

## 2015-09-05 DIAGNOSIS — G4440 Drug-induced headache, not elsewhere classified, not intractable: Secondary | ICD-10-CM | POA: Diagnosis not present

## 2015-09-05 DIAGNOSIS — R1013 Epigastric pain: Secondary | ICD-10-CM | POA: Diagnosis present

## 2015-09-05 DIAGNOSIS — F17210 Nicotine dependence, cigarettes, uncomplicated: Secondary | ICD-10-CM | POA: Diagnosis not present

## 2015-09-05 DIAGNOSIS — T3995XA Adverse effect of unspecified nonopioid analgesic, antipyretic and antirheumatic, initial encounter: Secondary | ICD-10-CM

## 2015-09-05 LAB — I-STAT BETA HCG BLOOD, ED (MC, WL, AP ONLY)

## 2015-09-05 LAB — URINALYSIS, ROUTINE W REFLEX MICROSCOPIC
Bilirubin Urine: NEGATIVE
Glucose, UA: NEGATIVE mg/dL
HGB URINE DIPSTICK: NEGATIVE
Ketones, ur: NEGATIVE mg/dL
Leukocytes, UA: NEGATIVE
Nitrite: NEGATIVE
PH: 6 (ref 5.0–8.0)
Protein, ur: NEGATIVE mg/dL
SPECIFIC GRAVITY, URINE: 1.015 (ref 1.005–1.030)

## 2015-09-05 LAB — COMPREHENSIVE METABOLIC PANEL
ALBUMIN: 4.4 g/dL (ref 3.5–5.0)
ALK PHOS: 76 U/L (ref 38–126)
ALT: 11 U/L — AB (ref 14–54)
AST: 14 U/L — AB (ref 15–41)
Anion gap: 6 (ref 5–15)
BUN: 10 mg/dL (ref 6–20)
CO2: 29 mmol/L (ref 22–32)
CREATININE: 0.63 mg/dL (ref 0.44–1.00)
Calcium: 9.3 mg/dL (ref 8.9–10.3)
Chloride: 104 mmol/L (ref 101–111)
GFR calc non Af Amer: >60 mL/min (ref >60)
GLUCOSE: 101 mg/dL — AB (ref 65–99)
Potassium: 4.2 mmol/L (ref 3.5–5.1)
SODIUM: 139 mmol/L (ref 135–145)
Total Bilirubin: 0.3 mg/dL (ref 0.3–1.2)
Total Protein: 7.8 g/dL (ref 6.5–8.1)

## 2015-09-05 LAB — CBC WITH DIFFERENTIAL/PLATELET
BASOS ABS: 0 10*3/uL (ref 0.0–0.1)
BASOS PCT: 0 %
EOS ABS: 0.1 10*3/uL (ref 0.0–0.7)
EOS PCT: 1 %
HCT: 44.4 % (ref 36.0–46.0)
HEMOGLOBIN: 14.5 g/dL (ref 12.0–15.0)
Lymphocytes Relative: 25 %
Lymphs Abs: 2.5 10*3/uL (ref 0.7–4.0)
MCH: 25.8 pg — AB (ref 26.0–34.0)
MCHC: 32.7 g/dL (ref 30.0–36.0)
MCV: 79 fL (ref 78.0–100.0)
Monocytes Absolute: 0.6 10*3/uL (ref 0.1–1.0)
Monocytes Relative: 6 %
NEUTROS PCT: 68 %
Neutro Abs: 6.9 10*3/uL (ref 1.7–7.7)
PLATELETS: 237 10*3/uL (ref 150–400)
RBC: 5.62 MIL/uL — AB (ref 3.87–5.11)
RDW: 15.6 % — ABNORMAL HIGH (ref 11.5–15.5)
WBC: 10.1 10*3/uL (ref 4.0–10.5)

## 2015-09-05 LAB — LIPASE, BLOOD: Lipase: 22 U/L (ref 11–51)

## 2015-09-05 MED ORDER — DIPHENHYDRAMINE HCL 50 MG/ML IJ SOLN
25.0000 mg | Freq: Once | INTRAMUSCULAR | Status: AC
  Administered 2015-09-05: 25 mg via INTRAVENOUS
  Filled 2015-09-05: qty 1

## 2015-09-05 MED ORDER — METOCLOPRAMIDE HCL 5 MG/ML IJ SOLN
10.0000 mg | Freq: Once | INTRAMUSCULAR | Status: AC
  Administered 2015-09-05: 10 mg via INTRAVENOUS
  Filled 2015-09-05: qty 2

## 2015-09-05 MED ORDER — BUTALBITAL-APAP-CAFFEINE 50-325-40 MG PO TABS
2.0000 | ORAL_TABLET | Freq: Once | ORAL | Status: AC
  Administered 2015-09-05: 2 via ORAL
  Filled 2015-09-05: qty 2

## 2015-09-05 MED ORDER — OMEPRAZOLE 20 MG PO CPDR: 20.0000 mg | DELAYED_RELEASE_CAPSULE | Freq: Every day | ORAL | 0 refills | Status: DC

## 2015-09-05 MED ORDER — GI COCKTAIL ~~LOC~~
30.0000 mL | Freq: Once | ORAL | Status: AC
  Administered 2015-09-05: 30 mL via ORAL
  Filled 2015-09-05: qty 30

## 2015-09-05 MED ORDER — SODIUM CHLORIDE 0.9 % IV BOLUS (SEPSIS)
1000.0000 mL | Freq: Once | INTRAVENOUS | Status: AC
  Administered 2015-09-05: 1000 mL via INTRAVENOUS

## 2015-09-05 MED ORDER — BUTALBITAL-APAP-CAFFEINE 50-325-40 MG PO TABS: 1.0000 | ORAL_TABLET | Freq: Four times a day (QID) | ORAL | 0 refills | Status: DC | PRN

## 2015-09-05 NOTE — ED Notes (Signed)
[place] at bedside.Labwork will be delayed.

## 2015-09-05 NOTE — ED Notes (Signed)
Bed: WA04 Expected date:  Expected time:  Means of arrival:  Comments: EMS-Headache

## 2015-09-05 NOTE — ED Notes (Signed)
Patient transported to CT 

## 2015-09-05 NOTE — ED Provider Notes (Signed)
[facility] DEPT Provider Note   CSN: [id number] Arrival date & time: 09/05/15  [id number]     History   Chief Complaint Chief Complaint  Patient presents with  . Abdominal Pain  . Headache    HPI Monique Fowler is a 50 y.o. female.  HPI 50 year old female with past medical history of hypertension, depression, cocaine abuse and bipolar disorder as well as chronic alcoholism who presents with headache and abdominal pain. Regarding her abdominal pain, she endorses a several month history of constant but intermittently worsening right upper quadrant pain. The patient states that she has an aching, throbbing cramping sensation in her epigastric and right upper quadrant region. This pain is worse with eating and resolved several hours after eating. She has nausea and she is in pain, but denies any vomiting. No diarrhea or change in bowel movements. No fevers or chills. She has no history of gallstones. Regarding her headache, she endorses a generalized but primarily posterior and frontal headache. The headache is an aching, throbbing sensation. She has a history of headaches but has never been imaged previously. She has been taking 10-12. Goody's powders per day for her headache and she has noticed that this is worsened. Her abdominal pain. Denies any neck stiffness. Denies any direct trauma. Denies any vision changes, numbness or weakness.  Past Medical History:  Diagnosis Date  . Alcohol abuse   . Bipolar affective ([place])   . Cocaine abuse   . Depression   . Hypertension     Patient Active Problem List   Diagnosis Date Noted  . Alcohol dependence with uncomplicated withdrawal ([place])   . Substance induced mood disorder ([place]) 07/09/2014  . Alcohol dependence ([place]) 07/09/2014  . Cocaine dependence with cocaine-induced mood disorder ([place]) 07/09/2014  . Bipolar I disorder, most recent episode depressed ([place]) 07/09/2014  . Fibroids 08/12/2010  . Dysmenorrhea 08/12/2010  . Adenomyosis  08/12/2010  . Menorrhagia 08/12/2010  . Smoker 08/12/2010  . GERD (gastroesophageal reflux disease) 08/12/2010  . Depression 08/12/2010    Past Surgical History:  Procedure Laterality Date  . CESAREAN SECTION    . KNEE SURGERY      OB History    No data available       Home Medications    Prior to Admission medications   Medication Sig Start Date End Date Taking? Authorizing Provider  ARIPiprazole (ABILIFY) 10 MG tablet Take 1 tablet (10 mg total) by mouth daily. 07/11/14   Shuvon B Rankin, NP  butalbital-acetaminophen-caffeine (FIORICET) 50-325-40 MG tablet Take 1 tablet by mouth every 6 (six) hours as needed for headache. 09/05/15 09/04/16  Duffy Bruce, MD  hydrOXYzine (ATARAX/VISTARIL) 25 MG tablet Take 1 tablet (25 mg total) by mouth 3 (three) times daily as needed (anxiety/agitation). 07/11/14   Shuvon B Rankin, NP  lisinopril (PRINIVIL,ZESTRIL) 20 MG tablet Take 1 tablet (20 mg total) by mouth daily. 07/11/14   Shuvon B Rankin, NP  mirtazapine (REMERON) 30 MG tablet Take 1 tablet (30 mg total) by mouth at bedtime. 07/11/14   Shuvon B Rankin, NP  Multiple Vitamin (MULTIVITAMIN WITH MINERALS) TABS tablet Take 1 tablet by mouth daily. 07/11/14   Shuvon B Rankin, NP  nicotine (NICODERM CQ - DOSED IN MG/24 HOURS) 21 mg/24hr patch Place 1 patch (21 mg total) onto the skin daily. 07/11/14   Kerrie Buffalo, NP  omeprazole (PRILOSEC) 20 MG capsule Take 1 capsule (20 mg total) by mouth daily. 09/05/15 09/19/15  Duffy Bruce, MD    Family History Family  History  Problem Relation Age of Onset  . Family history unknown: Yes    Social History Social History  Substance Use Topics  . Smoking status: Current Every Day Smoker    Packs/day: 0.50    Years: 16.00    Types: Cigarettes  . Smokeless tobacco: Never Used  . Alcohol use No     Comment: Last drink: 3 years ago     Allergies   Tramadol   Review of Systems Review of Systems  Constitutional: Negative for chills and fever.    HENT: Negative for congestion, rhinorrhea and sore throat.   Eyes: Negative for visual disturbance.  Respiratory: Negative for cough, shortness of breath and wheezing.   Cardiovascular: Negative for chest pain and leg swelling.  Gastrointestinal: Positive for abdominal pain and nausea. Negative for diarrhea and vomiting.  Genitourinary: Negative for dysuria, flank pain, vaginal bleeding and vaginal discharge.  Musculoskeletal: Negative for neck pain.  Skin: Negative for rash.  Allergic/Immunologic: Negative for immunocompromised state.  Neurological: Positive for headaches. Negative for syncope.  Hematological: Does not bruise/bleed easily.  All other systems reviewed and are negative.    Physical Exam Updated Vital Signs BP 116/78   Pulse 84   Temp 98.3 F (36.8 C) (Oral)   Resp 14   Ht 5\' 7"  (1.702 m)   Wt 219 lb (99.3 kg)   LMP 08/13/2012   SpO2 95%   BMI 34.30 kg/m   Physical Exam  Constitutional: She is oriented to person, place, and time. She appears well-developed and well-nourished. No distress.  HENT:  Head: Normocephalic and atraumatic.  Eyes: Conjunctivae are normal.  Neck: Normal range of motion. Neck supple.  Mild paraspinal tenderness along the upper cervical spine  Cardiovascular: Normal rate, regular rhythm and normal heart sounds.  Exam reveals no friction rub.   No murmur heard. Pulmonary/Chest: Effort normal and breath sounds normal. No respiratory distress. She has no wheezes. She has no rales.  Abdominal: Soft. She exhibits no distension. There is tenderness in the right upper quadrant and epigastric area. There is no rigidity, no rebound and no guarding.  Musculoskeletal: She exhibits no edema.  Neurological: She is alert and oriented to person, place, and time. She exhibits normal muscle tone.  Skin: Skin is warm. Capillary refill takes less than 2 seconds. No rash noted.  Psychiatric: She has a normal mood and affect.  Nursing note and vitals  reviewed.  Neurological Exam:  Mental Status: Alert and oriented to person, place, and time. Attention and concentration normal. Speech clear. Recent memory is intact. Cranial Nerves: Visual fields intact to confrontation in all quadrants bilaterally. EOMI and PERRLA. No nystagmus noted. Facial sensation intact at forehead, maxillary cheek, and chin/mandible bilaterally. No weakness of masticatory muscles. No facial asymmetry or weakness. Hearing grossly normal to finer rub. Uvula is midline, and palate elevates symmetrically. Normal SCM and trapezius strength. Tongue midline without fasciculations Motor: Muscle strength 5/5 in proximal and distal UE and LE bilaterally. No pronator drift. Muscle tone normal. Reflexes: 2+ and symmetrical in all four extremities.  Sensation: Intact to light touch in upper and lower extremities distally bilaterally.  Gait: Normal without ataxia. Coordination: Normal FTN bilaterally.     ED Treatments / Results  Labs (all labs ordered are listed, but only abnormal results are displayed) Labs Reviewed  CBC WITH DIFFERENTIAL/PLATELET - Abnormal; Notable for the following:       Result Value   RBC 5.62 (*)    MCH 25.8 (*)  RDW 15.6 (*)    All other components within normal limits  COMPREHENSIVE METABOLIC PANEL - Abnormal; Notable for the following:    Glucose, Bld 101 (*)    AST 14 (*)    ALT 11 (*)    All other components within normal limits  LIPASE, BLOOD  URINALYSIS, ROUTINE W REFLEX MICROSCOPIC (NOT AT [facility])  HIV ANTIBODY (ROUTINE TESTING)  I-STAT BETA HCG BLOOD, ED ([facility], [facility], [facility] ONLY)    EKG  EKG Interpretation None      Normal sinus rhythm, VR 82. Normal intervals. No acute ST segment changes. No change from 10/08/14.  Radiology Ct Head Wo Contrast  Result Date: 09/05/2015 CLINICAL DATA:  Two day history of headache with nausea and vomiting EXAM: CT HEAD WITHOUT CONTRAST TECHNIQUE: Contiguous axial images were obtained from the base of  the skull through the vertex without intravenous contrast. COMPARISON:  None. FINDINGS: Brain: The ventricles are normal in size and configuration. There is no intracranial mass, hemorrhage, extra-axial fluid collection, or midline shift. Gray-white compartments are normal. No acute infarct evident. Vascular: There is no hyperdense vessel. There is a small focus of calcification in the carotid siphon on the right. Skull: The bony calvarium appears intact. Sinuses/Orbits: Visualized paranasal sinuses are clear. There is leftward deviation of the nasal septum. Orbits appear symmetric bilaterally. Other: Mastoid air cells are clear. IMPRESSION: No intracranial mass, hemorrhage, or focal gray -white compartment lesion. Minimal calcification in the right carotid siphon. There is leftward deviation of the nasal septum. Electronically Signed   By: Lowella Grip III M.D.   On: 09/05/2015 10:32   US Abdomen Limited Ruq  Result Date: 09/05/2015 CLINICAL DATA:  Right upper quadrant pain for 2 months. EXAM: US ABDOMEN LIMITED - RIGHT UPPER QUADRANT COMPARISON:  None. FINDINGS: Gallbladder: No gallstones or wall thickening visualized. No sonographic Murphy sign noted by sonographer. Common bile duct: Diameter: 3.5 mm. Liver: No focal lesion identified. Slightly increased echogenicity usually seen with hepatic steatosis. IMPRESSION: No evidence of cholelithiasis or cholecystitis. Mild hepatic steatosis. Electronically Signed   By: Fidela Salisbury M.D.   On: 09/05/2015 09:52    Procedures Procedures (including critical care time)  Medications Ordered in ED Medications  sodium chloride 0.9 % bolus 1,000 mL (0 mLs Intravenous Stopped 09/05/15 1314)  metoCLOPramide (REGLAN) injection 10 mg (10 mg Intravenous Given 09/05/15 1001)  diphenhydrAMINE (BENADRYL) injection 25 mg (25 mg Intravenous Given 09/05/15 1001)  gi cocktail (Maalox,Lidocaine,Donnatal) (30 mLs Oral Given 09/05/15 1001)    butalbital-acetaminophen-caffeine (FIORICET, ESGIC) 50-325-40 MG per tablet 2 tablet (2 tablets Oral Given 09/05/15 1135)     Initial Impression / Assessment and Plan / ED Course  I have reviewed the triage vital signs and the nursing notes.  Pertinent labs & imaging results that were available during my care of the patient were reviewed by me and considered in my medical decision making (see chart for details).  Clinical Course   50 yo F with PMHx of bipolar disorder, HTN, chronic alcohol abuse who p/w headache and abdominal pain. VSS and WNL. Neuro exam is non-focal. Pt overall well appearing and in NAD.  Abdominal pain: Likely due to gastritis versus non-perforated ulcer. Pt has a h/o heavy ASA use for her HA and her sx are completely resolved with GI cocktail. No peritonitis, fever, or signs of perforation or infection. RUQ U/S shows no signs of cholecystitis. Lipase normal - no pancreatitis. Pt o/w well-appearing, tolerating PO. Will give PPI, advise pt to stop  NSAIDs. Refer for outpt PCP and possibly GI follow-up. NO signs of active bleeding and Hgb stable.  HA: Likely analgesic rebound HA. No h/o migraines. HA began gradually and is not c/w SAH. No fever, chills, or signs of meningitis or encephalitis. CT Head is negative for mass, bleed, or other abnormality. No sinusitis or signs of cavernous thrombosis. No red flags. Resolved with fioricet. Will give short course of fioricet to provide relief and dissuade pt from her heavy ASA use, PCP f/u.   Final Clinical Impressions(s) / ED Diagnoses   Final diagnoses:  RUQ pain  Analgesic rebound headache  Gastritis  Epigastric pain    New Prescriptions Discharge Medication List as of 09/05/2015 12:19 PM    START taking these medications   Details  butalbital-acetaminophen-caffeine (FIORICET) 50-325-40 MG tablet Take 1 tablet by mouth every 6 (six) hours as needed for headache., Starting Thu 09/05/2015, Until Fri 09/04/2016, Print     omeprazole (PRILOSEC) 20 MG capsule Take 1 capsule (20 mg total) by mouth daily., Starting Thu 09/05/2015, Until Thu 09/19/2015, Print         Duffy Bruce, MD 09/06/15 [phone]

## 2015-09-05 NOTE — ED Triage Notes (Signed)
[facility] with pt c/o RUQ abd pain x2 months and headache x2 days. Denies abd surgery or OBGYN concerns, also denies N/V/D or Fever.  Vitals stable.   120/82 82 HR 16 Resp 95% RA

## 2015-09-05 NOTE — ED Notes (Signed)
MD at bedside. 

## 2015-09-06 LAB — HIV ANTIBODY (ROUTINE TESTING W REFLEX): HIV Screen 4th Generation wRfx: NONREACTIVE

## 2015-09-15 ENCOUNTER — Emergency Department (HOSPITAL_COMMUNITY)
Admission: EM | Admit: 2015-09-15 | Discharge: 2015-09-16 | Payer: Medicaid Other | Attending: Emergency Medicine | Admitting: Emergency Medicine

## 2015-09-15 ENCOUNTER — Encounter (HOSPITAL_COMMUNITY): Payer: Self-pay | Admitting: Emergency Medicine

## 2015-09-15 DIAGNOSIS — F1424 Cocaine dependence with cocaine-induced mood disorder: Secondary | ICD-10-CM | POA: Diagnosis present

## 2015-09-15 DIAGNOSIS — R45851 Suicidal ideations: Secondary | ICD-10-CM

## 2015-09-15 DIAGNOSIS — R45850 Homicidal ideations: Secondary | ICD-10-CM

## 2015-09-15 DIAGNOSIS — F17210 Nicotine dependence, cigarettes, uncomplicated: Secondary | ICD-10-CM | POA: Insufficient documentation

## 2015-09-15 DIAGNOSIS — Z79899 Other long term (current) drug therapy: Secondary | ICD-10-CM | POA: Insufficient documentation

## 2015-09-15 DIAGNOSIS — I10 Essential (primary) hypertension: Secondary | ICD-10-CM | POA: Insufficient documentation

## 2015-09-15 DIAGNOSIS — R443 Hallucinations, unspecified: Secondary | ICD-10-CM

## 2015-09-15 LAB — CBC
HCT: 44.5 % (ref 36.0–46.0)
HEMOGLOBIN: 15.1 g/dL — AB (ref 12.0–15.0)
MCH: 26.3 pg (ref 26.0–34.0)
MCHC: 33.9 g/dL (ref 30.0–36.0)
MCV: 77.4 fL — ABNORMAL LOW (ref 78.0–100.0)
PLATELETS: 268 10*3/uL (ref 150–400)
RBC: 5.75 MIL/uL — ABNORMAL HIGH (ref 3.87–5.11)
RDW: 15.5 % (ref 11.5–15.5)
WBC: 11.1 10*3/uL — ABNORMAL HIGH (ref 4.0–10.5)

## 2015-09-15 LAB — RAPID URINE DRUG SCREEN, HOSP PERFORMED
Amphetamines: NOT DETECTED
Barbiturates: POSITIVE — AB
Benzodiazepines: NOT DETECTED
COCAINE: POSITIVE — AB
OPIATES: NOT DETECTED
TETRAHYDROCANNABINOL: NOT DETECTED

## 2015-09-15 LAB — SALICYLATE LEVEL

## 2015-09-15 LAB — ETHANOL

## 2015-09-15 LAB — COMPREHENSIVE METABOLIC PANEL
ALK PHOS: 85 U/L (ref 38–126)
ALT: 15 U/L (ref 14–54)
ANION GAP: 9 (ref 5–15)
AST: 14 U/L — ABNORMAL LOW (ref 15–41)
Albumin: 4.3 g/dL (ref 3.5–5.0)
BUN: 11 mg/dL (ref 6–20)
CALCIUM: 9.8 mg/dL (ref 8.9–10.3)
CO2: 27 mmol/L (ref 22–32)
Chloride: 104 mmol/L (ref 101–111)
Creatinine, Ser: 0.78 mg/dL (ref 0.44–1.00)
GFR calc non Af Amer: >60 mL/min (ref >60)
Glucose, Bld: 105 mg/dL — ABNORMAL HIGH (ref 65–99)
Potassium: 4 mmol/L (ref 3.5–5.1)
SODIUM: 140 mmol/L (ref 135–145)
Total Bilirubin: 0.5 mg/dL (ref 0.3–1.2)
Total Protein: 7.5 g/dL (ref 6.5–8.1)

## 2015-09-15 LAB — ACETAMINOPHEN LEVEL

## 2015-09-15 NOTE — ED Notes (Signed)
Pt has been wanded by security. 
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
## 2015-09-15 NOTE — ED Notes (Signed)
Pt. Transferred to [facility] from ED to room 38. Report to include Situation, Background, Assessment and Recommendations from [facility]. Pt. Oriented to unit including Q15 minute rounds as well as the security cameras for their protection. Patient is alert and oriented, warm and dry in no acute distress. Patient denies SI, HI, and AVH. Pt. Encouraged to let me know if needs arise.
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
## 2015-09-15 NOTE — ED Notes (Signed)
Patient noted sleeping in room. No complaints, stable, in no acute distress. Q15 minute rounds and monitoring via Security Cameras to continue.  
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
## 2015-09-16 ENCOUNTER — Encounter (HOSPITAL_COMMUNITY): Payer: Self-pay

## 2015-09-16 ENCOUNTER — Inpatient Hospital Stay (HOSPITAL_COMMUNITY)
Admission: AD | Admit: 2015-09-16 | Discharge: 2015-09-20 | DRG: 897 | Disposition: A | Discharge status: Home / Self Care | Payer: Medicaid Other | Source: Intra-hospital | Attending: Psychiatry | Admitting: Psychiatry

## 2015-09-16 DIAGNOSIS — R45850 Homicidal ideations: Secondary | ICD-10-CM | POA: Diagnosis present

## 2015-09-16 DIAGNOSIS — G4700 Insomnia, unspecified: Secondary | ICD-10-CM | POA: Diagnosis present

## 2015-09-16 DIAGNOSIS — F1424 Cocaine dependence with cocaine-induced mood disorder: Secondary | ICD-10-CM

## 2015-09-16 DIAGNOSIS — Z818 Family history of other mental and behavioral disorders: Secondary | ICD-10-CM

## 2015-09-16 DIAGNOSIS — Z590 Homelessness: Secondary | ICD-10-CM

## 2015-09-16 DIAGNOSIS — F1414 Cocaine abuse with cocaine-induced mood disorder: Secondary | ICD-10-CM | POA: Diagnosis present

## 2015-09-16 DIAGNOSIS — R45851 Suicidal ideations: Secondary | ICD-10-CM | POA: Diagnosis present

## 2015-09-16 DIAGNOSIS — I10 Essential (primary) hypertension: Secondary | ICD-10-CM | POA: Diagnosis present

## 2015-09-16 DIAGNOSIS — F17210 Nicotine dependence, cigarettes, uncomplicated: Secondary | ICD-10-CM | POA: Diagnosis present

## 2015-09-16 DIAGNOSIS — F329 Major depressive disorder, single episode, unspecified: Secondary | ICD-10-CM | POA: Diagnosis present

## 2015-09-16 MED ORDER — CITALOPRAM HYDROBROMIDE 10 MG PO TABS
10.0000 mg | ORAL_TABLET | Freq: Every day | ORAL | Status: DC
  Administered 2015-09-17 – 2015-09-18 (×2): 10 mg via ORAL
  Filled 2015-09-16 (×4): qty 1

## 2015-09-16 MED ORDER — PNEUMOCOCCAL VAC POLYVALENT 25 MCG/0.5ML IJ INJ
0.5000 mL | INJECTION | INTRAMUSCULAR | Status: AC
  Administered 2015-09-18: 0.5 mL via INTRAMUSCULAR

## 2015-09-16 MED ORDER — LOSARTAN POTASSIUM 50 MG PO TABS
100.0000 mg | ORAL_TABLET | Freq: Every day | ORAL | Status: DC
  Administered 2015-09-16: 100 mg via ORAL
  Filled 2015-09-16: qty 2

## 2015-09-16 MED ORDER — MAGNESIUM HYDROXIDE 400 MG/5ML PO SUSP
30.0000 mL | Freq: Every day | ORAL | Status: DC | PRN
  Administered 2015-09-18: 30 mL via ORAL
  Filled 2015-09-16: qty 30

## 2015-09-16 MED ORDER — DM-GUAIFENESIN ER 30-600 MG PO TB12
1.0000 | ORAL_TABLET | Freq: Two times a day (BID) | ORAL | Status: DC
  Administered 2015-09-16 – 2015-09-19 (×6): 1 via ORAL
  Filled 2015-09-16 (×11): qty 1

## 2015-09-16 MED ORDER — ALBUTEROL SULFATE HFA 108 (90 BASE) MCG/ACT IN AERS
1.0000 | INHALATION_SPRAY | RESPIRATORY_TRACT | Status: DC | PRN
  Administered 2015-09-16: 2 via RESPIRATORY_TRACT
  Filled 2015-09-16: qty 6.7

## 2015-09-16 MED ORDER — PANTOPRAZOLE SODIUM 40 MG PO TBEC
40.0000 mg | DELAYED_RELEASE_TABLET | Freq: Every day | ORAL | Status: DC
  Administered 2015-09-17 – 2015-09-20 (×4): 40 mg via ORAL
  Filled 2015-09-16 (×5): qty 1

## 2015-09-16 MED ORDER — BUTALBITAL-APAP-CAFFEINE 50-325-40 MG PO TABS
1.0000 | ORAL_TABLET | Freq: Four times a day (QID) | ORAL | Status: DC | PRN
  Administered 2015-09-16 – 2015-09-20 (×3): 1 via ORAL
  Filled 2015-09-16: qty 1
  Filled 2015-09-16: qty 2
  Filled 2015-09-16: qty 1

## 2015-09-16 MED ORDER — TRAZODONE HCL 100 MG PO TABS
100.0000 mg | ORAL_TABLET | Freq: Every evening | ORAL | Status: DC | PRN
  Administered 2015-09-16: 100 mg via ORAL
  Filled 2015-09-16 (×7): qty 1

## 2015-09-16 MED ORDER — HYDROXYZINE HCL 25 MG PO TABS
25.0000 mg | ORAL_TABLET | Freq: Four times a day (QID) | ORAL | Status: DC | PRN
  Administered 2015-09-16 – 2015-09-17 (×2): 25 mg via ORAL
  Filled 2015-09-16 (×2): qty 1

## 2015-09-16 MED ORDER — NICOTINE POLACRILEX 2 MG MT GUM: 2.0000 mg | CHEWING_GUM | OROMUCOSAL | Status: DC | PRN

## 2015-09-16 MED ORDER — CITALOPRAM HYDROBROMIDE 10 MG PO TABS
10.0000 mg | ORAL_TABLET | Freq: Every day | ORAL | Status: DC
  Administered 2015-09-16: 10 mg via ORAL
  Filled 2015-09-16: qty 1

## 2015-09-16 MED ORDER — ALUM & MAG HYDROXIDE-SIMETH 200-200-20 MG/5ML PO SUSP: 30.0000 mL | ORAL | Status: DC | PRN

## 2015-09-16 MED ORDER — MENTHOL 3 MG MT LOZG
1.0000 | LOZENGE | OROMUCOSAL | Status: DC | PRN
  Administered 2015-09-16: 3 mg via ORAL
  Filled 2015-09-16: qty 9

## 2015-09-16 MED ORDER — LISINOPRIL 10 MG PO TABS
10.0000 mg | ORAL_TABLET | Freq: Every day | ORAL | Status: DC
  Administered 2015-09-17 – 2015-09-20 (×4): 10 mg via ORAL
  Filled 2015-09-16 (×5): qty 1

## 2015-09-16 MED ORDER — ACETAMINOPHEN 325 MG PO TABS
650.0000 mg | ORAL_TABLET | Freq: Four times a day (QID) | ORAL | Status: DC | PRN
  Administered 2015-09-17: 650 mg via ORAL
  Filled 2015-09-16: qty 2

## 2015-09-16 MED ORDER — PANTOPRAZOLE SODIUM 40 MG PO TBEC
40.0000 mg | DELAYED_RELEASE_TABLET | Freq: Every day | ORAL | Status: DC
  Administered 2015-09-16: 40 mg via ORAL
  Filled 2015-09-16: qty 1

## 2015-09-16 MED ORDER — LOSARTAN POTASSIUM 50 MG PO TABS
100.0000 mg | ORAL_TABLET | Freq: Every day | ORAL | Status: DC
  Administered 2015-09-17 – 2015-09-20 (×4): 100 mg via ORAL
  Filled 2015-09-16 (×5): qty 2

## 2015-09-16 MED ORDER — BUTALBITAL-APAP-CAFFEINE 50-325-40 MG PO TABS: 1.0000 | ORAL_TABLET | Freq: Four times a day (QID) | ORAL | Status: DC | PRN

## 2015-09-16 MED ORDER — ALBUTEROL SULFATE HFA 108 (90 BASE) MCG/ACT IN AERS: 1.0000 | INHALATION_SPRAY | RESPIRATORY_TRACT | Status: DC

## 2015-09-16 MED ORDER — LISINOPRIL 10 MG PO TABS
10.0000 mg | ORAL_TABLET | Freq: Every day | ORAL | Status: DC
  Administered 2015-09-16: 10 mg via ORAL
  Filled 2015-09-16: qty 1

## 2015-09-16 MED ORDER — GUAIFENESIN-DM 100-10 MG/5ML PO SYRP
5.0000 mL | ORAL_SOLUTION | ORAL | Status: DC | PRN
  Administered 2015-09-16 – 2015-09-20 (×3): 5 mL via ORAL
  Filled 2015-09-16 (×3): qty 5

## 2015-09-16 MED ORDER — MENTHOL 3 MG MT LOZG: 1.0000 | LOZENGE | OROMUCOSAL | Status: DC | PRN
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
## 2015-09-16 NOTE — BH Assessment (Signed)
[place] Assessment Progress Note  Per Corena Pilgrim, MD, this pt requires psychiatric hospitalization at this time.  Letitia Libra, RN, [facility] has assigned pt to [facility] Rm [facility]; they will be ready to receive pt at 13:00.  Pt has signed Voluntary Admission and Consent for Treatment, as well as Consent to Release Information to no one, and signed forms have been faxed to [facility].  Pt's nurse, Diane, has been notified, and agrees to send original paperwork along with pt via [facility], and to call report to [phone].  Jalene Mullet, [place] Triage Specialist [phone]

## 2015-09-16 NOTE — ED Notes (Signed)
Patient noted sleeping in room. No complaints, stable, in no acute distress. Q15 minute rounds and monitoring via Security Cameras to continue.  

## 2015-09-16 NOTE — ED Notes (Signed)
Pt transported to [facility] by [employer] transportation. All belongings returned to pt who signed for same.

## 2015-09-16 NOTE — Tx Team (Signed)
Initial Treatment Plan 09/16/2015 3:25 PM Monique Fowler [id number]    PATIENT STRESSORS: Health problems Marital or family conflict Traumatic event   PATIENT STRENGTHS: Communication skills General fund of knowledge   PATIENT IDENTIFIED PROBLEMS: Depression  Anxiety  Suicidal ideation  "I just want to move on with my life without suicidal thoughts"  "I don't want to kill myself'             DISCHARGE CRITERIA:  Improved stabilization in mood, thinking, and/or behavior Verbal commitment to aftercare and medication compliance  PRELIMINARY DISCHARGE PLAN: Outpatient therapy Medication management  PATIENT/FAMILY INVOLVEMENT: This treatment plan has been presented to and reviewed with the patient, Monique Fowler.  The patient and family have been given the opportunity to ask questions and make suggestions.  Windell Moment, RN 09/16/2015, 3:25 PM

## 2015-09-16 NOTE — ED Notes (Signed)
Pt transferred to [facility] and this writer realized that there was one pill in pharmacy from home. James with security took the pill to [facility] and gave it to pt's new nurse, Mayra Neer.
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
## 2015-09-17 DIAGNOSIS — R45851 Suicidal ideations: Secondary | ICD-10-CM

## 2015-09-17 DIAGNOSIS — R45850 Homicidal ideations: Secondary | ICD-10-CM

## 2015-09-17 DIAGNOSIS — F1414 Cocaine abuse with cocaine-induced mood disorder: Principal | ICD-10-CM

## 2015-09-17 MED ORDER — TRAZODONE HCL 50 MG PO TABS
50.0000 mg | ORAL_TABLET | Freq: Every evening | ORAL | Status: DC | PRN
  Administered 2015-09-17: 50 mg via ORAL
  Filled 2015-09-17: qty 1

## 2015-09-17 MED ORDER — QUETIAPINE FUMARATE 50 MG PO TABS
50.0000 mg | ORAL_TABLET | Freq: Every day | ORAL | Status: DC
  Filled 2015-09-17: qty 1
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
## 2015-09-17 NOTE — Progress Notes (Signed)
D    Pt complained of cough and head and chest congestion   She endorses depression and anxiety    She is compliant with treatment   She interacts minimally with others A    Verbal support given   Medications administered and effectiveness monitored    Q 15 min checks R    Pt safe at present time

## 2015-09-17 NOTE — [facility] Group Notes (Signed)
[facility] Group Notes:  (Nursing/MHT/Case Management/Adjunct)  Date:  09/17/2015  Time:  9:27 AM  Type of Therapy:  Psychoeducational Skills  Participation Level:  Did Not Attend  Patient invited; declined to attend.  Patient is not feeling well today. Zipporah Plants 09/17/2015, 9:27 AM

## 2015-09-17 NOTE — Progress Notes (Signed)
Recreation Therapy Notes  Animal-Assisted Activity (AAA) Program Checklist/Progress Notes Patient Eligibility Criteria Checklist & Daily Group note for Rec TxIntervention  Date: 09.05.2017 Time: 2:45pm Location: [address]    AAA/T Program Assumption of Risk Form signed by Patient/ or Parent Legal Guardian Yes  Patient is free of allergies or sever asthma Yes  Patient reports no fear of animals Yes  Patient reports no history of cruelty to animals Yes  Patient understands his/her participation is voluntary Yes  Behavioral Response: Did not attend. Patient declined services during admission.   Laureen Ochs [place] Streat, LRT/CTRS  Duwane Gewirtz L 09/17/2015 3:00 PM

## 2015-09-17 NOTE — [facility] Group Notes (Signed)
[facility] LCSW Group Therapy 09/17/2015  1:15 PM   Type of Therapy: Group Therapy  Participation Level: Did Not Attend. Patient invited to participate but declined.   Tilden Fossa, MSW, [place] Clinical Social Worker [facility] [phone]
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
## 2015-09-17 NOTE — BHH Suicide Risk Assessment (Signed)
[place] INPATIENT:  Family/Significant Other Suicide Prevention Education  Suicide Prevention Education:  Patient Refusal for Family/Significant Other Suicide Prevention Education: The patient Monique Fowler has refused to provide written consent for family/significant other to be provided Family/Significant Other Suicide Prevention Education during admission and/or prior to discharge.  Physician notified. SPE reviewed with patient and brochure provided. Patient encouraged to return to hospital if having suicidal thoughts, patient verbalized his/her understanding and has no further questions at this time.   Abisai Deer L Tien Spooner 09/17/2015, 2:05 PM

## 2015-09-17 NOTE — Progress Notes (Signed)
Adult Psychoeducational Group Note  Date:  09/17/2015 Time:  9:03 PM  Group Topic/Focus:  Wrap-Up Group:   The focus of this group is to help patients review their daily goal of treatment and discuss progress on daily workbooks.   Participation Level:  Active  Participation Quality:  Appropriate  Affect:  Appropriate  Cognitive:  Alert  Insight: Appropriate  Engagement in Group:  Engaged  Modes of Intervention:  Discussion  Additional Comments:  Patient states, "I had a fair day. I did not have any suicidal thoughts". Patient's goal for today was to not be depressed. Patient met goal.  Riyah Bardon L Kess Mcilwain 09/17/2015, 9:03 PM

## 2015-09-18 DIAGNOSIS — F17210 Nicotine dependence, cigarettes, uncomplicated: Secondary | ICD-10-CM

## 2015-09-18 DIAGNOSIS — Z79899 Other long term (current) drug therapy: Secondary | ICD-10-CM

## 2015-09-18 LAB — LIPID PANEL
CHOL/HDL RATIO: 5.3 ratio
Cholesterol: 255 mg/dL — ABNORMAL HIGH (ref 0–200)
HDL: 48 mg/dL (ref >40)
LDL Cholesterol: 169 mg/dL — ABNORMAL HIGH (ref 0–99)
Triglycerides: 190 mg/dL — ABNORMAL HIGH (ref <150)
VLDL: 38 mg/dL (ref 0–40)

## 2015-09-18 LAB — TSH: TSH: 2.074 u[IU]/mL (ref 0.350–4.500)

## 2015-09-18 MED ORDER — TRAZODONE HCL 100 MG PO TABS
100.0000 mg | ORAL_TABLET | Freq: Every evening | ORAL | Status: DC | PRN
  Administered 2015-09-18 – 2015-09-19 (×2): 100 mg via ORAL
  Filled 2015-09-18 (×2): qty 1

## 2015-09-18 MED ORDER — CITALOPRAM HYDROBROMIDE 20 MG PO TABS
20.0000 mg | ORAL_TABLET | Freq: Every day | ORAL | Status: DC
  Administered 2015-09-19 – 2015-09-20 (×2): 20 mg via ORAL
  Filled 2015-09-18 (×3): qty 1

## 2015-09-18 NOTE — BHH Group Notes (Signed)
[place] LCSW Group Therapy  09/18/2015   1:15 PM   Type of Therapy:  Group Therapy  Participation Level:  Minimal  Participation Quality:  Attentive, Sharing  Affect:  Depressed and Flat  Cognitive:  Alert and Oriented  Insight:  Developing/Improving and Engaged  Engagement in Therapy:  Developing/Improving and Engaged  Modes of Intervention:  Clarification, Confrontation, Discussion, Education, Exploration, Limit-setting, Orientation, Problem-solving, Rapport Building, Art therapist, Socialization and Support  Summary of Progress/Problems: The topic for group therapy was feelings about diagnosis.  Pt actively participated in group discussion on their past and current diagnosis and how they feel towards this.  Pt also identified how society and family members judge them, based on their diagnosis as well as stereotypes and stigmas. Patient reports dealing with depression for 4 years, stating "I don't know why I feel this way". Group discussed common causes for mental illness. Patient left group early for unknown reason without returning.    Tilden Fossa, MSW, [place] Clinical Social Worker [facility] [phone]
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
## 2015-09-18 NOTE — Progress Notes (Signed)
Recreation Therapy Notes  Date: 09/18/15 Time: 0930 Location: 300 Hall Group Room  Group Topic: Stress Management  Goal Area(s) Addresses:  Patient will verbalize importance of using healthy stress management.  Patient will identify positive emotions associated with healthy stress management.   Behavioral Response: Engaged  Intervention: Stress Management  Activity :  Progressive Muscle Relaxation.  LRT introduced the technique of progressive muscle relaxation to patients.  LRT read script so patients to engage in technique.  Patients were to sit in a comfortable position and follow along as LRT read script.  Education:  Stress Management, Discharge Planning.   Education Outcome: Acknowledges edcuation/In group clarification offered/Needs additional education  Clinical Observations/Feedback: Pt attended group.    Victorino Sparrow, LRT/CTRS     Victorino Sparrow A 09/18/2015 12:09 PM

## 2015-09-18 NOTE — Progress Notes (Signed)
Monique Fowler had been up and visible in milieu this evening, did attend and participate in evening group activity. She spoke about how she has been feeling depressed and had been having suicidal thoughts but able to contract for safety in the hospital. She reports sleeping ok, received bedtime medications without incident and did not verbalize any complaints of pain. A. Support and encouragement provided. R. Safety maintained, will continue to monitor.

## 2015-09-18 NOTE — Progress Notes (Signed)
Pt did not attend NA group this evening.  
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
## 2015-09-18 NOTE — Progress Notes (Signed)
Adult Psychoeducational Group Note  Date:  09/18/2015 Time:  4:51 PM  Group Topic/Focus:  Goals Group:   The focus of this group is to help patients establish daily goals to achieve during treatment and discuss how the patient can incorporate goal setting into their daily lives to aide in recovery.   Participation Level:  Active  Participation Quality:  Appropriate  Affect:  Appropriate  Cognitive:  Appropriate  Insight: Appropriate  Engagement in Group:  Engaged  Modes of Intervention:  Discussion  Additional Comments:  Pt stated she was feeling depressed, but did not want to elaborate on her feelings.  Pt stated her goal for the day was to stay focused. Tonia Brooms D 09/18/2015, 4:51 PM
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
## 2015-09-18 NOTE — Plan of Care (Signed)
Problem: Coping: Goal: Ability to demonstrate self-control will improve Outcome: Progressing Pt was appropriate on the unit with no outburst
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
## 2015-09-19 LAB — PROLACTIN: PROLACTIN: 8.2 ng/mL (ref 4.8–23.3)

## 2015-09-19 LAB — HEMOGLOBIN A1C
HEMOGLOBIN A1C: 6.2 % — AB (ref 4.8–5.6)
Mean Plasma Glucose: 131 mg/dL

## 2015-09-19 MED ORDER — NICOTINE POLACRILEX 2 MG MT GUM: 2.0000 mg | CHEWING_GUM | OROMUCOSAL | 0 refills | Status: DC | PRN

## 2015-09-19 MED ORDER — PANTOPRAZOLE SODIUM 40 MG PO TBEC: 40.0000 mg | DELAYED_RELEASE_TABLET | Freq: Every day | ORAL | 0 refills | Status: DC

## 2015-09-19 MED ORDER — LISINOPRIL 10 MG PO TABS: 10.0000 mg | ORAL_TABLET | Freq: Every day | ORAL | 0 refills | Status: DC

## 2015-09-19 MED ORDER — BUTALBITAL-APAP-CAFFEINE 50-325-40 MG PO TABS: 1.0000 | ORAL_TABLET | Freq: Four times a day (QID) | ORAL | 0 refills | Status: DC | PRN

## 2015-09-19 MED ORDER — TRAZODONE HCL 100 MG PO TABS: 100.0000 mg | ORAL_TABLET | Freq: Every evening | ORAL | 0 refills | Status: DC | PRN

## 2015-09-19 MED ORDER — CITALOPRAM HYDROBROMIDE 20 MG PO TABS: 20.0000 mg | ORAL_TABLET | Freq: Every day | ORAL | 0 refills | Status: DC

## 2015-09-19 MED ORDER — LOSARTAN POTASSIUM 100 MG PO TABS: 100.0000 mg | ORAL_TABLET | Freq: Every day | ORAL | 0 refills | Status: DC

## 2015-09-19 NOTE — Progress Notes (Signed)
Pt attended karaoke group this evening.  

## 2015-09-19 NOTE — BHH Group Notes (Signed)
[place] LCSW Group Therapy  09/19/2015 2:37 PM  Type of Therapy:  Group Therapy  Participation Level:  Did Not Attend  Modes of Intervention:  Discussion, Education, Socialization and Support  Summary of Progress/Problems:Balance in life: Patients will discuss the concept of balance and how it looks and feels to be unbalanced. Pt will identify areas in their life that is unbalanced and ways to become more balanced.   [place] MSW, [place]  09/19/2015, 2:37 PM
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
## 2015-09-19 NOTE — Progress Notes (Signed)
D: Pt denies SI/HI/AVH. Pt is pleasant and cooperative. Pt stated she was feeling a little better. Pt was not as labile this evening and not as irritable.    A: Pt was offered support and encouragement. Pt was given scheduled medications. Pt was encourage to attend groups. Q 15 minute checks were done for safety.   R:Pt attends groups and interacts well with peers and staff. Pt is taking medication. Pt has no complaints.Pt receptive to treatment and safety maintained on unit.

## 2015-09-19 NOTE — Plan of Care (Signed)
Problem: Coping: Goal: Ability to cope will improve Outcome: Progressing Pt stated she was feeling a little better today

## 2015-09-19 NOTE — Progress Notes (Signed)
Adult Psychoeducational Group Note  Date:  09/19/2015 Time:  0900 am  Group Topic/Focus:  Orientation:   The focus of this group is to educate the patient on the purpose and policies of crisis stabilization and provide a format to answer questions about their admission.  The group details unit policies and expectations of patients while admitted.   Participation Level:  Did Not Attend  Participation Quality:    Affect:    Cognitive:    Insight:   Engagement in Group:    Modes of Intervention:    Additional Comments:   Fransheska Willingham L 09/19/2015, 3:44 PM
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
## 2015-09-20 NOTE — Progress Notes (Signed)
Recreation Therapy Notes  Date: 09/20/15 Time: 0930 Location: [address] Dayroom  Group Topic: Stress Management  Goal Area(s) Addresses:  Patient will verbalize importance of using healthy stress management.  Patient will identify positive emotions associated with healthy stress management.   Intervention: Stress Management  Activity :  Guided Imagery.  LRT introduced patients to the technique of guided imagery.  LRT read script to allow patients to participate in the technique.  Patients were to listen and follow along as LRT read script.  Education:  Stress Management, Discharge Planning.   Education Outcome: Acknowledges edcuation/In group clarification offered/Needs additional education  Clinical Observations/Feedback:  Pt did not attend group.   Victorino Sparrow, LRT/CTRS         Victorino Sparrow A 09/20/2015 12:22 PM

## 2015-09-20 NOTE — Progress Notes (Signed)
  [facility] Adult Case Management Discharge Plan :  Will you be returning to the same living situation after discharge:  No. At discharge, do you have transportation home?: Yes,  cab Do you have the ability to pay for your medications: Yes,  insurance   Release of information consent forms completed and in the chart;  Patient's signature needed at discharge.  Patient to Follow up at: Follow-up Information    [facility]. Go in 1 week(s).   Why:  Please use [facility] for hospital discharge follow up. Hours are Monday - Friday 8:30 - 3 PM.  Bring hospital discharge paperwork to this appointment.    Contact information: [place] Phone:  [phone] Fax: [phone]          Next level of care provider has access to [place] and Suicide Prevention discussed: Yes,  with patient   Have you used any form of tobacco in the last 30 days? (Cigarettes, Smokeless Tobacco, Cigars, and/or Pipes): Yes  Has patient been referred to the Quitline?: Patient refused referral  Patient has been referred for addiction treatment: Yes  [place] MSW, [place] 09/20/2015, 9:15 AM

## 2015-09-20 NOTE — Discharge Summary (Signed)
Physician Discharge Summary Note  Patient:  Monique Fowler is an 50 y.o., female MRN:  [id number] DOB:  [birth year] Patient phone:  [phone] (home)  Patient address:   [place] [address],  Total Time spent with patient: 30 minutes  Date of Admission:  09/16/2015 Date of Discharge: 09/20/2015  Reason for Admission:    Principal Problem: Cocaine abuse with cocaine-induced mood disorder [facility]) Discharge Diagnoses: Patient Active Problem List   Diagnosis Date Noted  . Cocaine abuse with cocaine-induced mood disorder ([place]) [F14.14] 09/16/2015    Priority: High  . Alcohol dependence with uncomplicated withdrawal ([place]) [F10.230]   . Substance induced mood disorder ([place]) [F19.94] 07/09/2014  . Alcohol dependence ([place]) [F10.20] 07/09/2014  . Cocaine dependence with cocaine-induced mood disorder ([place]) [F14.24] 07/09/2014  . Bipolar I disorder, most recent episode depressed ([place]) [F31.30] 07/09/2014  . Fibroids [D25.9] 08/12/2010  . Dysmenorrhea [N94.6] 08/12/2010  . Adenomyosis [N80.0] 08/12/2010  . Menorrhagia [N92.0] 08/12/2010  . Smoker [Z72.0] 08/12/2010  . GERD (gastroesophageal reflux disease) [K21.9] 08/12/2010  . Depression [F32.9] 08/12/2010    Past Psychiatric History: see HPI  Past Medical History:  Past Medical History:  Diagnosis Date  . Alcohol abuse   . Bipolar affective ([place])   . Cocaine abuse   . Depression   . Hypertension     Past Surgical History:  Procedure Laterality Date  . CESAREAN SECTION    . KNEE SURGERY     Family History:  Family History  Problem Relation Age of Onset  . Family history unknown: Yes   Family Psychiatric  History: see HPI Social History:  History  Alcohol Use No    Comment: Last drink: 3 years ago     History  Drug Use No    Comment: Last used cocaine 3 years ago.     Social History   Social History  . Marital status: Divorced    Spouse name: N/A  . Number of children: N/A  . Years of education: N/A    Social History Main Topics  . Smoking status: Current Every Day Smoker    Packs/day: 0.50    Years: 16.00    Types: Cigarettes  . Smokeless tobacco: Never Used  . Alcohol use No     Comment: Last drink: 3 years ago  . Drug use: No     Comment: Last used cocaine 3 years ago.   Marland Kitchen Sexual activity: Not Asked   Other Topics Concern  . None   Social History Narrative  . None    Hospital Course:   Monique Fowler, 50 yo came in with worsening depression.    Monique Fowler was admitted for Cocaine abuse with cocaine-induced mood disorder ([place]) and crisis management.  He was treated with medications with their indications listed in detail under Medication List.  Medical problems were identified and treated as needed.  Home medications were restarted as appropriate.  Improvement was monitored by observation and Monique Fowler daily report of symptom reduction.  Emotional and mental status was monitored by daily self inventory reports completed by Monique Fowler and clinical staff.  Patient reported continued improvement, denied any new concerns.  Patient had been compliant on medications and denied side effects.  Support and encouragement was provided.    Patient encouraged to attend groups to help with recognizing triggers of emotional crises and de-stabilizations.  Patient encouraged to attend group to help identify the positive things in life that would help in dealing with feelings  of loss, depression and unhealthy or abusive tendencies.         Monique Fowler was evaluated by the treatment team for stability and plans for continued recovery upon discharge.  She was offered further treatment options upon discharge including Residential, Intensive Outpatient and Outpatient treatment. She will follow up with agencies listed below for medication management and counseling.  Encouraged patient to maintain satisfactory support network and home environment.  Advised to adhere to medication compliance and  outpatient treatment follow up.  Prescriptions provided.       Monique Fowler motivation was an integral factor for scheduling further treatment.  Employment, transportation, bed availability, health status, family support, and any pending legal issues were also considered during her hospital stay.  Upon completion of this admission the patient was both mentally and medically stable for discharge denying suicidal/homicidal ideation, auditory/visual/tactile hallucinations, delusional thoughts and paranoia.      Physical Findings: AIMS: Facial and Oral Movements Muscles of Facial Expression: None, normal Lips and Perioral Area: None, normal Jaw: None, normal Tongue: None, normal,Extremity Movements Upper (arms, wrists, hands, fingers): None, normal Lower (legs, knees, ankles, toes): None, normal, Trunk Movements Neck, shoulders, hips: None, normal, Overall Severity Severity of abnormal movements (highest score from questions above): None, normal Incapacitation due to abnormal movements: None, normal Patient's awareness of abnormal movements (rate only patient's report): No Awareness, Dental Status Current problems with teeth and/or dentures?: No Does patient usually wear dentures?: No  CIWA:    COWS:     Musculoskeletal: Strength & Muscle Tone: within normal limits Gait & Station: normal Patient leans: N/A  Psychiatric Specialty Exam:  See MD SRA Physical Exam  Nursing note and vitals reviewed.   ROS  Blood pressure (!) 138/92, pulse 87, temperature 97.8 F (36.6 C), temperature source Oral, resp. rate 18, height 5\' 7"  (1.702 m), weight 94.8 kg (209 lb), last menstrual period 08/13/2012, SpO2 98 %.Body mass index is 32.73 kg/m.   Have you used any form of tobacco in the last 30 days? (Cigarettes, Smokeless Tobacco, Cigars, and/or Pipes): Yes  Has this patient used any form of tobacco in the last 30 days? (Cigarettes, Smokeless Tobacco, Cigars, and/or Pipes) Yes, Rx given   Blood  Alcohol level:  Lab Results  Component Value Date   ETH <5 09/15/2015   ETH <5 123456    Metabolic Disorder Labs:  Lab Results  Component Value Date   HGBA1C 6.2 (H) 09/18/2015   MPG 131 09/18/2015   Lab Results  Component Value Date   PROLACTIN 8.2 09/18/2015   Lab Results  Component Value Date   CHOL 255 (H) 09/18/2015   TRIG 190 (H) 09/18/2015   HDL 48 09/18/2015   CHOLHDL 5.3 09/18/2015   VLDL 38 09/18/2015   LDLCALC 169 (H) 09/18/2015    See Psychiatric Specialty Exam and Suicide Risk Assessment completed by Attending Physician prior to discharge.  Discharge destination:  Home  Is patient on multiple antipsychotic therapies at discharge:  No   Has Patient had three or more failed trials of antipsychotic monotherapy by history:  No  Recommended Plan for Multiple Antipsychotic Therapies: NA     Medication List    STOP taking these medications   omeprazole 20 MG capsule Commonly known as:  PRILOSEC Replaced by:  pantoprazole 40 MG tablet   oxyCODONE-acetaminophen 5-325 MG tablet Commonly known as:  PERCOCET/ROXICET     TAKE these medications     Indication  butalbital-acetaminophen-caffeine 50-325-40 MG tablet Commonly  known as:  FIORICET Take 1 tablet by mouth every 6 (six) hours as needed for headache.  Indication:  Migraine Headache, Tension Headache   citalopram 20 MG tablet Commonly known as:  CELEXA Take 1 tablet (20 mg total) by mouth daily.  Indication:  Depression   lisinopril 10 MG tablet Commonly known as:  PRINIVIL,ZESTRIL Take 1 tablet (10 mg total) by mouth daily.  Indication:  High Blood Pressure Disorder   losartan 100 MG tablet Commonly known as:  COZAAR Take 1 tablet (100 mg total) by mouth daily.  Indication:  High Blood Pressure Disorder   nicotine polacrilex 2 MG gum Commonly known as:  NICORETTE Take 1 each (2 mg total) by mouth as needed for smoking cessation.  Indication:  Nicotine Addiction   pantoprazole 40  MG tablet Commonly known as:  PROTONIX Take 1 tablet (40 mg total) by mouth daily. Replaces:  omeprazole 20 MG capsule  Indication:  Gastroesophageal Reflux Disease   traZODone 100 MG tablet Commonly known as:  DESYREL Take 1 tablet (100 mg total) by mouth at bedtime as needed for sleep.  Indication:  Trouble Sleeping      Follow-up Information    [facility]. Go on 09/23/2015.   Why:  Hospital discharge follow up appointment w Chip Boer on 9/11 at 2:30 PM.   Bring hospital discharge paperwork to this appointment.    Contact information: [place] Phone:  [phone] Fax: [phone]          Follow-up recommendations:  Activity:  as tol Diet:  as tol  Comments:  1.  Take all your medications as prescribed.   2.  Report any adverse side effects to outpatient provider. 3.  Patient instructed to not use alcohol or illegal drugs while on prescription medicines. 4.  In the event of worsening symptoms, instructed patient to call 911, the crisis hotline or go to nearest emergency room for evaluation of symptoms.  Signed: Janett Labella, NP [facility] 09/20/2015, 12:43 PM  Patient seen, Suicide Assessment Completed.  Disposition Plan Reviewed

## 2015-09-20 NOTE — Progress Notes (Signed)
Patient discharged per physician order; patient denies SI/HI and A/V hallucinations; patient received prescriptions,  AVS, copy of the suicide safety plan, suicide risk assessment note, and transition record given to the patient after it was reviewed; patient had no other questions or concerns at this time; patient verbalized and signed that all belongings were returned; patient left the unit ambulatory via the [place] taxi

## 2015-11-27 ENCOUNTER — Emergency Department (HOSPITAL_COMMUNITY)
Admission: EM | Admit: 2015-11-27 | Discharge: 2015-11-27 | Disposition: A | Discharge status: Home / Self Care | Payer: Medicaid Other | Attending: Emergency Medicine | Admitting: Emergency Medicine

## 2015-11-27 ENCOUNTER — Emergency Department (HOSPITAL_COMMUNITY): Payer: Medicaid Other

## 2015-11-27 ENCOUNTER — Encounter (HOSPITAL_COMMUNITY): Payer: Self-pay

## 2015-11-27 DIAGNOSIS — Z7982 Long term (current) use of aspirin: Secondary | ICD-10-CM | POA: Diagnosis not present

## 2015-11-27 DIAGNOSIS — Z79899 Other long term (current) drug therapy: Secondary | ICD-10-CM | POA: Diagnosis not present

## 2015-11-27 DIAGNOSIS — I10 Essential (primary) hypertension: Secondary | ICD-10-CM | POA: Insufficient documentation

## 2015-11-27 DIAGNOSIS — F17210 Nicotine dependence, cigarettes, uncomplicated: Secondary | ICD-10-CM | POA: Insufficient documentation

## 2015-11-27 DIAGNOSIS — J069 Acute upper respiratory infection, unspecified: Secondary | ICD-10-CM | POA: Diagnosis not present

## 2015-11-27 MED ORDER — IPRATROPIUM-ALBUTEROL 0.5-2.5 (3) MG/3ML IN SOLN
3.0000 mL | Freq: Once | RESPIRATORY_TRACT | Status: AC
  Administered 2015-11-27: 3 mL via RESPIRATORY_TRACT
  Filled 2015-11-27: qty 3

## 2015-11-27 MED ORDER — ALBUTEROL SULFATE HFA 108 (90 BASE) MCG/ACT IN AERS
1.0000 | INHALATION_SPRAY | Freq: Once | RESPIRATORY_TRACT | Status: AC
  Administered 2015-11-27: 1 via RESPIRATORY_TRACT
  Filled 2015-11-27: qty 6.7

## 2015-11-27 MED ORDER — BENZONATATE 100 MG PO CAPS: 100.0000 mg | ORAL_CAPSULE | Freq: Three times a day (TID) | ORAL | 0 refills | Status: DC

## 2015-11-27 NOTE — Discharge Instructions (Signed)
Please read attached information. If you experience any new or worsening signs or symptoms please return to the emergency room for evaluation. Please follow-up with your primary care provider or specialist as discussed. Please use medication prescribed only as directed and discontinue taking if you have any concerning signs or symptoms.   °

## 2015-11-27 NOTE — ED Triage Notes (Signed)
She c/o cough/intermittent fever/aches since last Thurs. She is in no distress.

## 2015-11-27 NOTE — ED Notes (Signed)
Patient transported to X-ray 

## 2015-11-27 NOTE — ED Triage Notes (Signed)
She states she was advised by her pain clinic provider to come here to "be checked".

## 2015-11-27 NOTE — ED Provider Notes (Signed)
[facility] DEPT Provider Note   CSN: [id number] Arrival date & time: 11/27/15  1208     History   Chief Complaint Chief Complaint  Patient presents with  . Influenza    HPI Monique Fowler is a 50 y.o. female.  HPI   50 year old female presents today with upper respiratory infection. Patient reports proximal 6 days ago she started developing rhinorrhea, congestion, cough, and minor shortness of breath. She reports symptoms have persisted with slight worsening. She denies any significant shortness of breath, chest pain, fever. Patient reports she is a smoker, no other risk factors for significant pulmonary infection. Patient was seen by pain management today instructed follow-up in the emergency room for evaluation.   Past Medical History:  Diagnosis Date  . Alcohol abuse   . Bipolar affective ([place])   . Cocaine abuse   . Depression   . Hypertension     Patient Active Problem List   Diagnosis Date Noted  . Cocaine abuse with cocaine-induced mood disorder ([place]) 09/16/2015  . Alcohol dependence with uncomplicated withdrawal ([place])   . Substance induced mood disorder ([place]) 07/09/2014  . Alcohol dependence ([place]) 07/09/2014  . Cocaine dependence with cocaine-induced mood disorder ([place]) 07/09/2014  . Bipolar I disorder, most recent episode depressed ([place]) 07/09/2014  . Fibroids 08/12/2010  . Dysmenorrhea 08/12/2010  . Adenomyosis 08/12/2010  . Menorrhagia 08/12/2010  . Smoker 08/12/2010  . GERD (gastroesophageal reflux disease) 08/12/2010  . Depression 08/12/2010    Past Surgical History:  Procedure Laterality Date  . CESAREAN SECTION    . KNEE SURGERY      OB History    No data available       Home Medications    Prior to Admission medications   Medication Sig Start Date End Date Taking? Authorizing Provider  albuterol (PROVENTIL HFA;VENTOLIN HFA) 108 (90 Base) MCG/ACT inhaler Inhale 1-2 puffs into the lungs every 6 (six) hours as needed for wheezing or  shortness of breath.   Yes Historical Provider, MD  Aspirin-Acetaminophen-Caffeine (GOODY HEADACHE PO) Take 1 each by mouth every 4 (four) hours as needed (pain).   Yes Historical Provider, MD  lisinopril (PRINIVIL,ZESTRIL) 10 MG tablet Take 1 tablet (10 mg total) by mouth daily. 09/19/15  Yes Kerrie Buffalo, NP  oxyCODONE-acetaminophen (PERCOCET/ROXICET) 5-325 MG tablet Take 1 tablet by mouth 3 (three) times daily as needed for pain. 10/08/15  Yes Historical Provider, MD  Phenylephrine-DM-GG-APAP (THERAFLU EXPRESSMAX SEV CLD/FL PO) Take 30 mLs by mouth 2 (two) times daily as needed (cold symptoms).   Yes Historical Provider, MD  benzonatate (TESSALON) 100 MG capsule Take 1 capsule (100 mg total) by mouth every 8 (eight) hours. 11/27/15   Okey Regal, PA-C  butalbital-acetaminophen-caffeine (FIORICET) 50-325-40 MG tablet Take 1 tablet by mouth every 6 (six) hours as needed for headache. Patient not taking: Reported on 11/27/2015 09/19/15   Kerrie Buffalo, NP  citalopram (CELEXA) 20 MG tablet Take 1 tablet (20 mg total) by mouth daily. Patient not taking: Reported on 11/27/2015 09/19/15   Kerrie Buffalo, NP  losartan (COZAAR) 100 MG tablet Take 1 tablet (100 mg total) by mouth daily. Patient not taking: Reported on 11/27/2015 09/19/15   Kerrie Buffalo, NP  nicotine polacrilex (NICORETTE) 2 MG gum Take 1 each (2 mg total) by mouth as needed for smoking cessation. Patient not taking: Reported on 11/27/2015 09/19/15   Kerrie Buffalo, NP  pantoprazole (PROTONIX) 40 MG tablet Take 1 tablet (40 mg total) by mouth daily. Patient not taking: Reported on  11/27/2015 09/19/15   Kerrie Buffalo, NP  traZODone (DESYREL) 100 MG tablet Take 1 tablet (100 mg total) by mouth at bedtime as needed for sleep. Patient not taking: Reported on 11/27/2015 09/19/15   Kerrie Buffalo, NP    Family History Family History  Problem Relation Age of Onset  . Family history unknown: Yes    Social History Social History  Substance Use  Topics  . Smoking status: Current Every Day Smoker    Packs/day: 0.50    Years: 16.00    Types: Cigarettes  . Smokeless tobacco: Never Used  . Alcohol use No     Comment: Last drink: 3 years ago     Allergies   Tramadol   Review of Systems Review of Systems  All other systems reviewed and are negative.    Physical Exam Updated Vital Signs BP 126/88   Pulse 95   Temp 99.1 F (37.3 C) (Oral)   Resp 16   LMP 08/13/2012   SpO2 96%   Physical Exam  Constitutional: She is oriented to person, place, and time. She appears well-developed and well-nourished.  HENT:  Head: Normocephalic and atraumatic.  Nose: Rhinorrhea present.  Eyes: Conjunctivae are normal. Pupils are equal, round, and reactive to light. Right eye exhibits no discharge. Left eye exhibits no discharge. No scleral icterus.  Neck: Normal range of motion. No JVD present. No tracheal deviation present.  Pulmonary/Chest: Effort normal. No stridor. No respiratory distress. She has wheezes. She has no rales. She exhibits no tenderness.  Neurological: She is alert and oriented to person, place, and time. Coordination normal.  Psychiatric: She has a normal mood and affect. Her behavior is normal. Judgment and thought content normal.  Nursing note and vitals reviewed.   ED Treatments / Results  Labs (all labs ordered are listed, but only abnormal results are displayed) Labs Reviewed - No data to display  EKG  EKG Interpretation None       Radiology Dg Chest 2 View  Result Date: 11/27/2015 CLINICAL DATA:  Productive cough for 6 days. EXAM: CHEST  2 VIEW COMPARISON:  Chest radiograph 10/10/2013 FINDINGS: The cardiomediastinal contours are normal. Stable chronic central bronchitic change. Pulmonary vasculature is normal. No consolidation, pleural effusion, or pneumothorax. No acute osseous abnormalities are seen. IMPRESSION: No acute cardiopulmonary abnormality. Electronically Signed   By: Jeb Levering M.D.    On: 11/27/2015 14:36    Procedures Procedures (including critical care time)  Medications Ordered in ED Medications  ipratropium-albuterol (DUONEB) 0.5-2.5 (3) MG/3ML nebulizer solution 3 mL (3 mLs Nebulization Given 11/27/15 1455)  albuterol (PROVENTIL HFA;VENTOLIN HFA) 108 (90 Base) MCG/ACT inhaler 1-2 puff (1 puff Inhalation Given 11/27/15 1549)     Initial Impression / Assessment and Plan / ED Course  I have reviewed the triage vital signs and the nursing notes.  Pertinent labs & imaging results that were available during my care of the patient were reviewed by me and considered in my medical decision making (see chart for details).  Clinical Course       Final Clinical Impressions(s) / ED Diagnoses   Final diagnoses:  Upper respiratory tract infection, unspecified type    Labs:   Imaging: DG chest 2 view  Consults:  Therapeutics:  Discharge Meds:   Assessment/Plan:  50 year old female presents likely viral URI. She has very minor wheeze, afebrile and nontoxic. Chest x-ray shows no acute findings, she'll be treated symptomatically with primary care follow-up and strict return precautions. She verbalized understanding and agreement  to today's plan had no further questions or concerns.       New Prescriptions Discharge Medication List as of 11/27/2015  3:30 PM    START taking these medications   Details  benzonatate (TESSALON) 100 MG capsule Take 1 capsule (100 mg total) by mouth every 8 (eight) hours., Starting Wed 11/27/2015, Print         Okey Regal, PA-C 11/27/15 1557    Virgel Manifold, MD 11/30/15 1048

## 2015-12-25 ENCOUNTER — Other Ambulatory Visit: Payer: Self-pay | Admitting: Internal Medicine

## 2015-12-25 DIAGNOSIS — Z1231 Encounter for screening mammogram for malignant neoplasm of breast: Secondary | ICD-10-CM

## 2015-12-26 ENCOUNTER — Other Ambulatory Visit: Payer: Self-pay | Admitting: Specialist

## 2015-12-26 DIAGNOSIS — N63 Unspecified lump in unspecified breast: Secondary | ICD-10-CM

## 2016-01-03 ENCOUNTER — Other Ambulatory Visit: Payer: Self-pay

## 2016-01-07 ENCOUNTER — Other Ambulatory Visit: Payer: Self-pay

## 2016-01-08 ENCOUNTER — Ambulatory Visit
Admission: RE | Admit: 2016-01-08 | Discharge: 2016-01-08 | Disposition: A | Discharge status: Home / Self Care | Payer: Medicaid Other | Source: Ambulatory Visit | Attending: Specialist | Admitting: Specialist

## 2016-01-08 ENCOUNTER — Other Ambulatory Visit: Payer: Self-pay | Admitting: Specialist

## 2016-01-08 DIAGNOSIS — N63 Unspecified lump in unspecified breast: Secondary | ICD-10-CM

## 2016-01-08 DIAGNOSIS — N6320 Unspecified lump in the left breast, unspecified quadrant: Secondary | ICD-10-CM

## 2016-01-16 ENCOUNTER — Other Ambulatory Visit: Payer: Self-pay | Admitting: Specialist

## 2016-01-16 ENCOUNTER — Ambulatory Visit
Admission: RE | Admit: 2016-01-16 | Discharge: 2016-01-16 | Disposition: A | Discharge status: Home / Self Care | Payer: Medicaid Other | Source: Ambulatory Visit | Attending: Specialist | Admitting: Specialist

## 2016-01-16 ENCOUNTER — Other Ambulatory Visit: Payer: Self-pay

## 2016-01-16 DIAGNOSIS — N6320 Unspecified lump in the left breast, unspecified quadrant: Secondary | ICD-10-CM

## 2016-01-16 HISTORY — PX: BREAST BIOPSY: SHX20

## 2016-04-09 ENCOUNTER — Other Ambulatory Visit: Payer: Self-pay | Admitting: General Surgery

## 2016-04-12 DIAGNOSIS — N6320 Unspecified lump in the left breast, unspecified quadrant: Secondary | ICD-10-CM

## 2016-04-12 HISTORY — DX: Unspecified lump in the left breast, unspecified quadrant: N63.20

## 2016-04-15 ENCOUNTER — Encounter (HOSPITAL_BASED_OUTPATIENT_CLINIC_OR_DEPARTMENT_OTHER): Payer: Self-pay | Admitting: *Deleted

## 2016-04-15 NOTE — Progress Notes (Signed)
Patient states she is living with a friend Maudie Mercury now, states she would like to stay 23 hr obs as she does not have any person there to be with her after surgery. She states that she will have someone come to pick her up the morning after.

## 2016-04-19 NOTE — H&P (Signed)
Monique Fowler Location: [facility] Patient #: [id number] DOB: 1966-01-03 Single / Language: Monique Fowler / Race: Black or African American Female        History of Present Illness       This is a 51 year old African American female, referred by Monique Fowler for evaluation of a persistent, enlarging left breast mass.      She states that she noticed a lump lateral to the left areola in December. She's had no significant pain or drainage. There's been no treatment for infection. She says this is slowly enlarging. She underwent mammogram and ultrasound showed an indeterminate solid mass at the 3:00 location of the left breast 1 cm from the nipple. 1 cm diameter. Image guided biopsy shows dense inflammation. Possible history abscess. No malignancy or atypia she says this continues to enlarge and she is concerned      Past history is significant for BMI 33, asthma, hypertension. Other listed problems include bipolar disorder, alcohol and cocaine dependence. Family history reveals breast cancer in a maternal first and at age 20. No ovarian cancer. Mother died of coronary artery disease. Father died of liver cancer, details unknown Social history reveals she is single. Has 6 children. She is disabled because of her knee. Smokes a half a pack of cigarettes per day. Denies alcohol.      We had a long talk. Over 30 minutes. Since this is a solitary dominant mass that is enlarging, solid, then conservative excision to confirm diagnosis is appropriate. I told her that most likely this is a obstructive milk duct or chronic abscess or other benign disorder. She'll be scheduled for excision left breast mass under general anesthesia as outpatient in the near future I discussed the indications, details, techniques, and numerous risk of the surgery with her. She is aware of the risk of bleeding, infection, reoperation of cancer, cosmetic deformity, nerve damage with chronic pain. I  discussed different incisions. She is comfortable circumareolar incision. All of her questions were answered. She agrees with this plan.    Past Surgical History  Spinal Surgery - Lower Back   Diagnostic Studies History  Mammogram  >3 years ago  Allergies  Ultram *ANALGESICS - OPIOID*  Nausea, Vomiting. Allergies Reconciled   Medication History  Proventil HFA (108 (90 Base)MCG/ACT Aerosol Soln, Inhalation as needed) Active. Aspirin-Acetaminophen-Caffeine (226-160-32MG  Tablet, Oral as needed) Active. Benzonatate (100MG  Capsule, Oral daily) Active. Fioricet (50-325-40MG  Tablet, Oral as needed) Active. Citalopram Hydrobromide (20MG  Tablet, Oral daily) Active. Lisinopril (10MG  Tablet, Oral daily) Active. Losartan Potassium (50MG  Tablet, Oral daily) Active. Nicoderm CQ (14MG /24HR Patch 24HR, Transdermal daily) Active. Pantoprazole Sodium (40MG  Tablet DR, Oral daily) Active. TraMADol HCl (50MG  Tablet, Oral as needed) Active. Montelukast Sodium (10MG  Tablet, Oral daily) Active. Atenolol (50MG  Tablet, Oral daily) Active. Medications Reconciled  Social History  Alcohol use  Remotely quit alcohol use. Caffeine use  Coffee, Tea. Illicit drug use  Remotely quit drug use. Tobacco use  Current every day smoker.  Family History  Alcohol Abuse  Brother, Daughter, Father. Depression  Father, Mother, Sister. Heart disease in female family member before age 51  Heart disease in female family member before age 29  Hypertension  Father, Mother. Kidney Disease  Father. Respiratory Condition  Father.  Pregnancy / Birth History Age at menarche  31 years. Age of menopause  45-50 Gravida  8 Irregular periods  Maternal age  64-25 Para  46  Other Problems  Back Pain  Gastric Ulcer     Review of  Systems  General Not Present- Appetite Loss, Chills, Fatigue, Fever, Night Sweats, Weight Gain and Weight Loss. Skin Not Present- Change in Wart/Mole,  Dryness, Hives, Jaundice, New Lesions, Non-Healing Wounds, Rash and Ulcer. HEENT Present- Wears glasses/contact lenses. Not Present- Earache, Hearing Loss, Hoarseness, Nose Bleed, Oral Ulcers, Ringing in the Ears, Seasonal Allergies, Sinus Pain, Sore Throat, Visual Disturbances and Yellow Eyes. Respiratory Not Present- Bloody sputum, Chronic Cough, Difficulty Breathing, Snoring and Wheezing. Breast Present- Breast Mass. Not Present- Breast Pain, Nipple Discharge and Skin Changes. Cardiovascular Present- Leg Cramps. Not Present- Chest Pain, Difficulty Breathing Lying Down, Palpitations, Rapid Heart Rate, Shortness of Breath and Swelling of Extremities. Gastrointestinal Not Present- Abdominal Pain, Bloating, Bloody Stool, Change in Bowel Habits, Chronic diarrhea, Constipation, Difficulty Swallowing, Excessive gas, Gets full quickly at meals, Hemorrhoids, Indigestion, Nausea, Rectal Pain and Vomiting. Musculoskeletal Present- Back Pain. Not Present- Joint Pain, Joint Stiffness, Muscle Pain, Muscle Weakness and Swelling of Extremities. Psychiatric Present- Anxiety, Bipolar, Change in Sleep Pattern, Depression and Fearful. Not Present- Frequent crying. Endocrine Not Present- Cold Intolerance, Excessive Hunger, Hair Changes, Heat Intolerance, Hot flashes and New Diabetes. Hematology Not Present- Blood Thinners, Easy Bruising, Excessive bleeding, Gland problems, HIV and Persistent Infections.  Vitals  Weight: 214.8 lb Height: 67.5in Body Surface Area: 2.1 m Body Mass Index: 33.15 kg/m  Temp.: 98.90F  Pulse: 64 (Regular)  BP: 112/64 (Sitting, Left Arm, Standard)    Physical Exam General Mental Status-Alert. General Appearance-Consistent with stated age. Hydration-Well hydrated. Voice-Normal.  Head and Neck Head-normocephalic, atraumatic with no lesions or palpable masses. Trachea-midline. Thyroid Gland Characteristics - normal size and consistency.  Eye Eyeball -  Bilateral-Extraocular movements intact. Sclera/Conjunctiva - Bilateral-No scleral icterus.  Chest and Lung Exam Chest and lung exam reveals -quiet, even and easy respiratory effort with no use of accessory muscles and on auscultation, normal breath sounds, no adventitious sounds and normal vocal resonance. Inspection Chest Wall - Normal. Back - normal.  Breast Breast - Left-Symmetric, Non Tender, No Biopsy scars, no Dimpling, No Inflammation, No Lumpectomy scars, No Mastectomy scars, No Peau d' Orange. Breast - Right-Symmetric, Non Tender, No Biopsy scars, no Dimpling, No Inflammation, No Lumpectomy scars, No Mastectomy scars, No Peau d' Orange. Breast Lump-No Palpable Breast Mass. Note: Both breasts are examined. In the left breast at the 3 o'clock position immediately lateral to the areolar margin is a 1.5 cm firm nontender mass. This does not appear infected. There is no nipple discharge. There are no scars in the area. There is no other mass in either breast. There is no axillary adenopathy.   Cardiovascular Cardiovascular examination reveals -normal heart sounds, regular rate and rhythm with no murmurs and normal pedal pulses bilaterally.  Abdomen Inspection Inspection of the abdomen reveals - No Hernias. Skin - Scar - no surgical scars. Palpation/Percussion Palpation and Percussion of the abdomen reveal - Soft, Non Tender, No Rebound tenderness, No Rigidity (guarding) and No hepatosplenomegaly. Auscultation Auscultation of the abdomen reveals - Bowel sounds normal.  Neurologic Neurologic evaluation reveals -alert and oriented x 3 with no impairment of recent or remote memory. Mental Status-Normal.  Musculoskeletal Normal Exam - Left-Upper Extremity Strength Normal and Lower Extremity Strength Normal. Normal Exam - Right-Upper Extremity Strength Normal and Lower Extremity Strength Normal.  Lymphatic Head & Neck  General Head & Neck Lymphatics:  Bilateral - Description - Normal. Axillary  General Axillary Region: Bilateral - Description - Normal. Tenderness - Non Tender. Femoral & Inguinal  Generalized Femoral & Inguinal Lymphatics: Bilateral - Description -  Normal. Tenderness - Non Tender.    Assessment & Plan LEFT BREAST MASS (N63.20)   You have a solitary mass in your left breast just lateral to the areolar margin The biopsy shows chronic inflammation or chronic infection Since this is a solitary mass, and it is enlarging, I agree that excision of this is reasonable This is most likely a benign infection or obstructed milk duct   Scheduled for excision left breast mass in the near future I discussed the indications, techniques, and risk of this surgery in detail  Please read the printed information that we have given you  ASTHMA IN ADULT (J45.909) HYPERTENSION, BENIGN (I10) BMI 33.0-33.9,ADULT (K34.91)   Edsel Petrin. Dalbert Batman, M.D., [facility] General and Minimally invasive Surgery Breast and Colorectal Surgery Office:   [phone] Pager:   [phone]

## 2016-04-22 ENCOUNTER — Encounter (HOSPITAL_BASED_OUTPATIENT_CLINIC_OR_DEPARTMENT_OTHER): Payer: Self-pay | Admitting: Anesthesiology

## 2016-04-22 MED ORDER — LIDOCAINE 2% (20 MG/ML) 5 ML SYRINGE
INTRAMUSCULAR | Status: AC
  Filled 2016-04-22: qty 5

## 2016-04-22 MED ORDER — DEXAMETHASONE SODIUM PHOSPHATE 10 MG/ML IJ SOLN
INTRAMUSCULAR | Status: AC
  Filled 2016-04-22: qty 1

## 2016-04-22 MED ORDER — ONDANSETRON HCL 4 MG/2ML IJ SOLN
INTRAMUSCULAR | Status: AC
  Filled 2016-04-22: qty 2

## 2016-04-22 MED ORDER — PROPOFOL 10 MG/ML IV BOLUS
INTRAVENOUS | Status: AC
  Filled 2016-04-22: qty 20

## 2016-04-22 MED ORDER — BUPIVACAINE HCL (PF) 0.5 % IJ SOLN
INTRAMUSCULAR | Status: AC
  Filled 2016-04-22: qty 30

## 2016-04-28 ENCOUNTER — Encounter (HOSPITAL_BASED_OUTPATIENT_CLINIC_OR_DEPARTMENT_OTHER): Payer: Self-pay | Admitting: *Deleted

## 2016-04-28 NOTE — Pre-Procedure Instructions (Signed)
To come for CMET, CBC, diff; to pick up Boost Breeze 8 oz. - to drink by [phone] DOS

## 2016-05-05 ENCOUNTER — Ambulatory Visit (HOSPITAL_BASED_OUTPATIENT_CLINIC_OR_DEPARTMENT_OTHER): Admission: RE | Admit: 2016-05-05 | Payer: Medicaid Other | Source: Ambulatory Visit | Admitting: General Surgery

## 2016-05-05 HISTORY — DX: Gastro-esophageal reflux disease without esophagitis: K21.9

## 2016-05-05 HISTORY — DX: Unspecified osteoarthritis, unspecified site: M19.90

## 2016-05-05 HISTORY — DX: Unspecified asthma, uncomplicated: J45.909

## 2016-05-05 HISTORY — DX: Presence of dental prosthetic device (complete) (partial): Z97.2

## 2016-05-05 HISTORY — DX: Unspecified lump in the left breast, unspecified quadrant: N63.20

## 2016-05-05 SURGERY — EXCISION MASS
Anesthesia: General | Laterality: Left

## 2017-02-08 DIAGNOSIS — R1013 Epigastric pain: Secondary | ICD-10-CM | POA: Insufficient documentation

## 2017-02-24 DIAGNOSIS — M7918 Myalgia, other site: Secondary | ICD-10-CM | POA: Insufficient documentation

## 2017-02-24 DIAGNOSIS — M545 Low back pain, unspecified: Secondary | ICD-10-CM | POA: Insufficient documentation

## 2017-02-24 DIAGNOSIS — G8929 Other chronic pain: Secondary | ICD-10-CM | POA: Insufficient documentation

## 2017-02-24 DIAGNOSIS — G894 Chronic pain syndrome: Secondary | ICD-10-CM | POA: Insufficient documentation

## 2017-02-25 DIAGNOSIS — J301 Allergic rhinitis due to pollen: Secondary | ICD-10-CM | POA: Insufficient documentation

## 2017-02-25 DIAGNOSIS — Z716 Tobacco abuse counseling: Secondary | ICD-10-CM | POA: Insufficient documentation

## 2017-02-25 DIAGNOSIS — R0602 Shortness of breath: Secondary | ICD-10-CM | POA: Insufficient documentation

## 2017-03-26 DIAGNOSIS — N611 Abscess of the breast and nipple: Secondary | ICD-10-CM | POA: Insufficient documentation

## 2017-06-24 ENCOUNTER — Other Ambulatory Visit: Payer: Self-pay

## 2017-06-24 ENCOUNTER — Encounter (HOSPITAL_COMMUNITY): Payer: Self-pay

## 2017-06-24 ENCOUNTER — Emergency Department (HOSPITAL_COMMUNITY)
Admission: EM | Admit: 2017-06-24 | Discharge: 2017-06-24 | Disposition: A | Discharge status: Home / Self Care | Payer: Medicaid Other | Attending: Emergency Medicine | Admitting: Emergency Medicine

## 2017-06-24 DIAGNOSIS — J0101 Acute recurrent maxillary sinusitis: Secondary | ICD-10-CM | POA: Insufficient documentation

## 2017-06-24 DIAGNOSIS — Z79899 Other long term (current) drug therapy: Secondary | ICD-10-CM | POA: Diagnosis not present

## 2017-06-24 DIAGNOSIS — J45909 Unspecified asthma, uncomplicated: Secondary | ICD-10-CM | POA: Diagnosis not present

## 2017-06-24 DIAGNOSIS — I10 Essential (primary) hypertension: Secondary | ICD-10-CM | POA: Diagnosis not present

## 2017-06-24 DIAGNOSIS — F17210 Nicotine dependence, cigarettes, uncomplicated: Secondary | ICD-10-CM | POA: Insufficient documentation

## 2017-06-24 DIAGNOSIS — R42 Dizziness and giddiness: Secondary | ICD-10-CM | POA: Diagnosis present

## 2017-06-24 LAB — I-STAT CHEM 8, ED
BUN: 12 mg/dL (ref 6–20)
CHLORIDE: 101 mmol/L (ref 101–111)
Calcium, Ion: 1.18 mmol/L (ref 1.15–1.40)
Creatinine, Ser: 0.9 mg/dL (ref 0.44–1.00)
GLUCOSE: 101 mg/dL — AB (ref 65–99)
HCT: 43 % (ref 36.0–46.0)
Hemoglobin: 14.6 g/dL (ref 12.0–15.0)
POTASSIUM: 4.5 mmol/L (ref 3.5–5.1)
SODIUM: 136 mmol/L (ref 135–145)
TCO2: 27 mmol/L (ref 22–32)

## 2017-06-24 LAB — I-STAT BETA HCG BLOOD, ED (MC, WL, AP ONLY)

## 2017-06-24 MED ORDER — AMOXICILLIN-POT CLAVULANATE 875-125 MG PO TABS
1.0000 | ORAL_TABLET | Freq: Two times a day (BID) | ORAL | 0 refills | Status: AC
Start: 2017-06-24 — End: 2017-07-04

## 2017-06-24 MED ORDER — DEXAMETHASONE SODIUM PHOSPHATE 10 MG/ML IJ SOLN
10.0000 mg | Freq: Once | INTRAMUSCULAR | Status: AC
  Administered 2017-06-24: 10 mg via INTRAVENOUS
  Filled 2017-06-24: qty 1

## 2017-06-24 MED ORDER — FLUTICASONE PROPIONATE 50 MCG/ACT NA SUSP: 2.0000 | Freq: Every day | NASAL | 0 refills | Status: AC

## 2017-06-24 MED ORDER — MECLIZINE HCL 25 MG PO TABS: 25.0000 mg | ORAL_TABLET | Freq: Three times a day (TID) | ORAL | 0 refills | Status: AC | PRN

## 2017-06-24 MED ORDER — METOCLOPRAMIDE HCL 5 MG/ML IJ SOLN
10.0000 mg | Freq: Once | INTRAMUSCULAR | Status: AC
  Administered 2017-06-24: 10 mg via INTRAVENOUS
  Filled 2017-06-24: qty 2

## 2017-06-24 MED ORDER — SODIUM CHLORIDE 0.9 % IV BOLUS
1000.0000 mL | Freq: Once | INTRAVENOUS | Status: AC
  Administered 2017-06-24: 1000 mL via INTRAVENOUS

## 2017-06-24 MED ORDER — DIPHENHYDRAMINE HCL 50 MG/ML IJ SOLN
25.0000 mg | Freq: Once | INTRAMUSCULAR | Status: AC
  Administered 2017-06-24: 25 mg via INTRAVENOUS
  Filled 2017-06-24: qty 1

## 2017-06-24 NOTE — ED Triage Notes (Signed)
Patient reports weakness, dizziness, for nausea that has been going on for 2 weeks and progressively getting worse. No recent sick contacts. Denies vomiting. Reports dizziness is in head. Patient went to primary care provider, orthostatic vitals taken there, and patient states everything was fine. Patient alert, oriented,and in no distress. Hx HTN.

## 2017-06-24 NOTE — ED Notes (Signed)
Patient verbalized understanding of discharge instructions, no questions. IV removed, bleeding controlled. Patient ambulated out of ED with steady gait in no distress.  

## 2017-06-24 NOTE — ED Notes (Signed)
Assisted patient to the bathroom. Patient ambulated well. Urine sample at bedside.

## 2017-06-24 NOTE — ED Provider Notes (Signed)
[facility] DEPT Provider Note   CSN: [id number] Arrival date & time: 06/24/17  0901     History   Chief Complaint Chief Complaint  Patient presents with  . Dizziness  . Nausea  . Headache  . Weakness    HPI Monique Fowler is a 52 y.o. female.  HPI 52 year old female with past medical history as below here with dizziness and sinus pressure.  The patient states that her symptoms started over the last week.  She noticed nasal congestion and yellow-green nasal discharge.  With the last several days, she felt generally lightheaded, worse when she bends forward, with associated pressure-like frontal headache.  She is had some cough.  She said intermittent nausea.  She is a history of recurrent sinusitis but states her current symptoms are more severe.  No fevers.  No posterior neck pain or stiffness.  No difficulty swallowing.  No sputum production.  No shortness of breath at rest.  Past Medical History:  Diagnosis Date  . Arthritis    right knee  . Asthma    prn inhaler  . Bipolar affective ([place])   . Breast mass, left 04/2016  . Depression   . GERD (gastroesophageal reflux disease)    no current med.  . Hypertension    states BP "sometimes" under control with med., has been on med. x 3 yr.  . Wears partial dentures    upper    Patient Active Problem List   Diagnosis Date Noted  . Cocaine abuse with cocaine-induced mood disorder ([place]) 09/16/2015  . Alcohol dependence with uncomplicated withdrawal ([place])   . Substance induced mood disorder ([place]) 07/09/2014  . Alcohol dependence ([place]) 07/09/2014  . Cocaine dependence with cocaine-induced mood disorder ([place]) 07/09/2014  . Bipolar I disorder, most recent episode depressed ([place]) 07/09/2014  . Fibroids 08/12/2010  . Dysmenorrhea 08/12/2010  . Adenomyosis 08/12/2010  . Menorrhagia 08/12/2010  . Smoker 08/12/2010  . GERD (gastroesophageal reflux disease) 08/12/2010  . Depression 08/12/2010     Past Surgical History:  Procedure Laterality Date  . CESAREAN SECTION    . KNEE ARTHROSCOPY Right   . KNEE SURGERY       OB History   None      Home Medications    Prior to Admission medications   Medication Sig Start Date End Date Taking? Authorizing Provider  albuterol (PROVENTIL HFA;VENTOLIN HFA) 108 (90 Base) MCG/ACT inhaler Inhale 1-2 puffs into the lungs every 6 (six) hours as needed for wheezing or shortness of breath.   Yes [provider]  budesonide-formoterol (SYMBICORT) 80-4.5 MCG/ACT inhaler Inhale 1 puff into the lungs 2 (two) times daily. 02/25/17 02/25/18 Yes [provider]  lisinopril-hydrochlorothiazide (PRINZIDE,ZESTORETIC) 20-12.5 MG tablet Take 1 tablet by mouth daily. 06/21/17  Yes [provider]  omeprazole (PRILOSEC) 20 MG capsule Take 20 mg by mouth daily. 06/01/17  Yes [provider]  ondansetron (ZOFRAN) 8 MG tablet Take 8 mg by mouth every 8 (eight) hours as needed. 05/25/17  Yes [provider]  amoxicillin-clavulanate (AUGMENTIN) 875-125 MG tablet Take 1 tablet by mouth every 12 (twelve) hours for 10 days. 06/24/17 07/04/17  Duffy Bruce, MD  fluticasone (FLONASE) 50 MCG/ACT nasal spray Place 2 sprays into both nostrils daily. 06/24/17 07/24/17  Duffy Bruce, MD  meclizine (ANTIVERT) 25 MG tablet Take 1 tablet (25 mg total) by mouth 3 (three) times daily as needed for dizziness. 06/24/17   Duffy Bruce, MD    Family History Family History  Family history unknown: Yes    Social History Social History   Tobacco Use  . Smoking status: Current Every Day Smoker    Packs/day: 0.00    Years: 34.00    Pack years: 0.00    Types: Cigarettes  . Smokeless tobacco: Never Used  . Tobacco comment: 5 cig./day  Substance Use Topics  . Alcohol use: No  . Drug use: No     Allergies   Tramadol; Acetaminophen; and Aspirin   Review of Systems Review of Systems  Constitutional: Positive for fatigue.   HENT: Positive for congestion, sinus pressure and sinus pain.   Neurological: Positive for dizziness.  All other systems reviewed and are negative.    Physical Exam Updated Vital Signs BP 106/84 (BP Location: Right Arm)   Pulse 74   Temp 98.3 F (36.8 C) (Oral)   Resp 16   Ht 5\' 7"  (1.702 m)   Wt 96.6 kg (213 lb)   LMP 08/19/2012   SpO2 100%   BMI 33.36 kg/m   Physical Exam  Constitutional: She is oriented to person, place, and time. She appears well-developed and well-nourished. No distress.  HENT:  Head: Normocephalic and atraumatic.  [place] bilateral nasal congestion and edema with yellow-green discharge.  Erythema posterior pharynx without tonsillar swelling or exudates.  Serous effusions bilateral tympanic membranes.  Eyes: Conjunctivae are normal.  Neck: Neck supple.  Cardiovascular: Normal rate, regular rhythm and normal heart sounds. Exam reveals no friction rub.  No murmur heard. Pulmonary/Chest: Effort normal and breath sounds normal. No respiratory distress. She has no wheezes. She has no rales.  Abdominal: She exhibits no distension.  Musculoskeletal: She exhibits no edema.  Neurological: She is alert and oriented to person, place, and time. She exhibits normal muscle tone.  Skin: Skin is warm. Capillary refill takes less than 2 seconds.  Psychiatric: She has a normal mood and affect.  Nursing note and vitals reviewed.   Neurological Exam:  Mental Status: Alert and oriented to person, place, and time. Attention and concentration normal. Speech clear. Recent memory is intact. Cranial Nerves: Visual fields grossly intact. EOMI and PERRLA. No nystagmus noted. Facial sensation intact at forehead, maxillary cheek, and chin/mandible bilaterally. No facial asymmetry or weakness. Hearing grossly normal. Uvula is midline, and palate elevates symmetrically. Normal SCM and trapezius strength. Tongue midline without fasciculations. Motor: Muscle strength 5/5 in proximal and  distal UE and LE bilaterally. No pronator drift. Muscle tone normal. Reflexes: 2+ and symmetrical in all four extremities.  Sensation: Intact to light touch in upper and lower extremities distally bilaterally.  Gait: Normal without ataxia. Coordination: Normal FTN bilaterally.    ED Treatments / Results  Labs (all labs ordered are listed, but only abnormal results are displayed) Labs Reviewed  I-STAT CHEM 8, ED - Abnormal; Notable for the following components:      Result Value   Glucose, Bld 101 (*)    All other components within normal limits  I-STAT BETA HCG BLOOD, ED ([facility], [facility], [facility] ONLY)    EKG None  Radiology No results found.  Procedures Procedures (including critical care time)  Medications Ordered in ED Medications  sodium chloride 0.9 % bolus 1,000 mL (1,000 mLs Intravenous New Bag/Given 06/24/17 0940)  dexamethasone (DECADRON) injection 10 mg (10 mg Intravenous Given 06/24/17 0941)  metoCLOPramide (REGLAN) injection 10 mg (10 mg Intravenous Given 06/24/17 0943)  diphenhydrAMINE (BENADRYL) injection 25 mg (25 mg Intravenous Given 06/24/17 0942)     Initial Impression / Assessment and  Plan / ED Course  I have reviewed the triage vital signs and the nursing notes.  Pertinent labs & imaging results that were available during my care of the patient were reviewed by me and considered in my medical decision making (see chart for details).     52 year old female here with sinus congestion, pressure, frontal headache, and general lightheadedness.  Suspect sinusitis with possible component of mild related vertigo.  She has completely nonfocal normal neurological exam with no dysarthria, dysphagia, or symptoms to suggest central etiology or stroke.  She was given a migraine cocktail and Decadron with significant relief of her symptoms.  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  Will treat her for sinusitis, start on Flonase, give meclizine as needed. No neck stiffness, fever, or  s/s meningitis or encephalitis. Mastoids normal b/l.  Final Clinical Impressions(s) / ED Diagnoses   Final diagnoses:  Acute recurrent maxillary sinusitis    ED Discharge Orders        Ordered    meclizine (ANTIVERT) 25 MG tablet  3 times daily PRN     06/24/17 1146    amoxicillin-clavulanate (AUGMENTIN) 875-125 MG tablet  Every 12 hours     06/24/17 1146    fluticasone (FLONASE) 50 MCG/ACT nasal spray  Daily     06/24/17 1146       Duffy Bruce, MD 06/24/17 1149

## 2017-09-07 ENCOUNTER — Encounter (HOSPITAL_COMMUNITY): Payer: Self-pay | Admitting: Emergency Medicine

## 2017-09-07 ENCOUNTER — Other Ambulatory Visit: Payer: Self-pay

## 2017-09-07 ENCOUNTER — Emergency Department (HOSPITAL_COMMUNITY)
Admission: EM | Admit: 2017-09-07 | Discharge: 2017-09-07 | Disposition: A | Discharge status: Home / Self Care | Payer: Medicaid Other | Attending: Emergency Medicine | Admitting: Emergency Medicine

## 2017-09-07 DIAGNOSIS — Z5321 Procedure and treatment not carried out due to patient leaving prior to being seen by health care provider: Secondary | ICD-10-CM | POA: Insufficient documentation

## 2017-09-07 DIAGNOSIS — M79671 Pain in right foot: Secondary | ICD-10-CM | POA: Insufficient documentation

## 2017-09-07 DIAGNOSIS — R05 Cough: Secondary | ICD-10-CM | POA: Diagnosis present

## 2017-09-07 NOTE — ED Triage Notes (Signed)
Pt reports dry cough x 1 month , HX TB  2004. Denies weight loss nor night sweats. Requesting chest xray to present to her employer.   Also reports right foot pain , near ankle, painful ROM, pain radiation to upper leg and hip. denies injury nor Hx sciatica.

## 2017-09-07 NOTE — ED Notes (Signed)
Pt LWBS.  Pt  Arrived at ED at 10:50

## 2018-01-10 ENCOUNTER — Emergency Department (HOSPITAL_COMMUNITY)
Admission: EM | Admit: 2018-01-10 | Discharge: 2018-01-10 | Discharge status: Against Medical Advice | Payer: Medicaid Other | Attending: Emergency Medicine | Admitting: Emergency Medicine

## 2018-01-10 ENCOUNTER — Encounter (HOSPITAL_COMMUNITY): Payer: Self-pay | Admitting: Emergency Medicine

## 2018-01-10 DIAGNOSIS — R05 Cough: Secondary | ICD-10-CM | POA: Diagnosis not present

## 2018-01-10 DIAGNOSIS — Z5321 Procedure and treatment not carried out due to patient leaving prior to being seen by health care provider: Secondary | ICD-10-CM | POA: Insufficient documentation

## 2018-01-10 DIAGNOSIS — H5789 Other specified disorders of eye and adnexa: Secondary | ICD-10-CM | POA: Insufficient documentation
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
## 2018-01-10 NOTE — ED Notes (Signed)
Pt stated to registration that she no longer wanted to be seen

## 2018-05-01 ENCOUNTER — Encounter (HOSPITAL_COMMUNITY): Payer: Self-pay | Admitting: *Deleted

## 2018-05-01 ENCOUNTER — Emergency Department (HOSPITAL_COMMUNITY)
Admission: EM | Admit: 2018-05-01 | Discharge: 2018-05-01 | Disposition: A | Discharge status: Home / Self Care | Payer: Medicaid Other | Attending: Emergency Medicine | Admitting: Emergency Medicine

## 2018-05-01 ENCOUNTER — Emergency Department (HOSPITAL_COMMUNITY): Payer: Medicaid Other

## 2018-05-01 ENCOUNTER — Other Ambulatory Visit: Payer: Self-pay

## 2018-05-01 DIAGNOSIS — J45909 Unspecified asthma, uncomplicated: Secondary | ICD-10-CM | POA: Insufficient documentation

## 2018-05-01 DIAGNOSIS — J040 Acute laryngitis: Secondary | ICD-10-CM | POA: Insufficient documentation

## 2018-05-01 DIAGNOSIS — Z79899 Other long term (current) drug therapy: Secondary | ICD-10-CM | POA: Diagnosis not present

## 2018-05-01 DIAGNOSIS — R05 Cough: Secondary | ICD-10-CM | POA: Diagnosis present

## 2018-05-01 DIAGNOSIS — I10 Essential (primary) hypertension: Secondary | ICD-10-CM | POA: Diagnosis not present

## 2018-05-01 DIAGNOSIS — F17210 Nicotine dependence, cigarettes, uncomplicated: Secondary | ICD-10-CM | POA: Diagnosis not present

## 2018-05-01 DIAGNOSIS — J40 Bronchitis, not specified as acute or chronic: Secondary | ICD-10-CM | POA: Insufficient documentation

## 2018-05-01 LAB — GROUP A STREP BY PCR: Group A Strep by PCR: NOT DETECTED

## 2018-05-01 MED ORDER — BENZONATATE 100 MG PO CAPS: 100.0000 mg | ORAL_CAPSULE | Freq: Three times a day (TID) | ORAL | 0 refills | Status: AC

## 2018-05-01 MED ORDER — AZITHROMYCIN 250 MG PO TABS: ORAL_TABLET | ORAL | 0 refills | Status: DC

## 2018-05-01 MED ORDER — HYDROCODONE-HOMATROPINE 5-1.5 MG/5ML PO SYRP: ORAL_SOLUTION | ORAL | 0 refills | Status: DC

## 2018-05-01 MED ORDER — AZITHROMYCIN 250 MG PO TABS: ORAL_TABLET | ORAL | 0 refills | Status: AC

## 2018-05-01 MED ORDER — BENZONATATE 100 MG PO CAPS
200.0000 mg | ORAL_CAPSULE | Freq: Once | ORAL | Status: AC
  Administered 2018-05-01: 20:00:00 200 mg via ORAL
  Filled 2018-05-01: qty 2

## 2018-05-01 MED ORDER — HYDROCODONE-HOMATROPINE 5-1.5 MG/5ML PO SYRP: ORAL_SOLUTION | ORAL | 0 refills | Status: AC

## 2018-05-01 NOTE — ED Triage Notes (Signed)
Pt dx with Strep throat and completed treatment 3 weeks ago, since then she has had a dry cough, It is becoming worse with some intervals of production yellow secretions. Throat is again becoming sore again from cough.

## 2018-05-01 NOTE — Discharge Instructions (Signed)
1.  Start your a azithromycin (Z-Pak) as prescribed. 2.  You may take Tessalon Perles for daytime cough.  Swallow it whole, do not chew or suck the tablet.  If you have severe cough and need additional cough medication, you may take hydrocodone 1 to 2 teaspoons every 6 hours.  This medication may make you drowsy.  Do not take it if you will be driving or doing any activities. 3.  Review your discharge instructions.  This time, I do not have high suspicion for coronavirus, however all people with cough and bronchitis type symptoms may have coronavirus.  Follow all precautions for avoiding others staying at home and social distancing and hand sanitizing and following proper adequate for coughing and sneezing.

## 2018-05-01 NOTE — ED Provider Notes (Signed)
[facility] DEPT Provider Note   CSN: [id number] Arrival date & time: 05/01/18  1847    History   Chief Complaint Chief Complaint  Patient presents with  . Cough    HPI Monique Fowler is a 53 y.o. female.     HPI Patient reports she was diagnosed with strep throat 3 weeks ago and took antibiotics.  She reports that she did get better but then started having coughing and burning throat pain again after completing her first course of treatment.  She reports this is gotten worse and her voice is now cracking in her throat is burning.  She reports she is also coughing a lot.  She reports that she does not have any known exposure to anyone with coronavirus illness.  She does not work and has been staying at home.  She reports she has had diarrhea for the past week a couple times per day.  Patient  smokes. Past Medical History:  Diagnosis Date  . Arthritis    right knee  . Asthma    prn inhaler  . Bipolar affective ([place])   . Breast mass, left 04/2016  . Depression   . GERD (gastroesophageal reflux disease)    no current med.  . Hypertension    states BP "sometimes" under control with med., has been on med. x 3 yr.  . Wears partial dentures    upper    Patient Active Problem List   Diagnosis Date Noted  . Cocaine abuse with cocaine-induced mood disorder ([place]) 09/16/2015  . Alcohol dependence with uncomplicated withdrawal ([place])   . Substance induced mood disorder ([place]) 07/09/2014  . Alcohol dependence ([place]) 07/09/2014  . Cocaine dependence with cocaine-induced mood disorder ([place]) 07/09/2014  . Bipolar I disorder, most recent episode depressed ([place]) 07/09/2014  . Fibroids 08/12/2010  . Dysmenorrhea 08/12/2010  . Adenomyosis 08/12/2010  . Menorrhagia 08/12/2010  . Smoker 08/12/2010  . GERD (gastroesophageal reflux disease) 08/12/2010  . Depression 08/12/2010    Past Surgical History:  Procedure Laterality Date  . CESAREAN SECTION    . KNEE  ARTHROSCOPY Right   . KNEE SURGERY       OB History   No obstetric history on file.      Home Medications    Prior to Admission medications   Medication Sig Start Date End Date Taking? Authorizing Provider  albuterol (PROVENTIL HFA;VENTOLIN HFA) 108 (90 Base) MCG/ACT inhaler Inhale 1-2 puffs into the lungs every 6 (six) hours as needed for wheezing or shortness of breath.    [provider]  azithromycin (ZITHROMAX Z-PAK) 250 MG tablet 2 po day one, then 1 daily x 4 days 05/01/18   Charlesetta Shanks, MD  benzonatate (TESSALON) 100 MG capsule Take 1 capsule (100 mg total) by mouth every 8 (eight) hours. 05/01/18   Charlesetta Shanks, MD  budesonide-formoterol (SYMBICORT) 80-4.5 MCG/ACT inhaler Inhale 1 puff into the lungs 2 (two) times daily. 02/25/17 02/25/18  [provider]  fluticasone (FLONASE) 50 MCG/ACT nasal spray Place 2 sprays into both nostrils daily. 06/24/17 07/24/17  Duffy Bruce, MD  HYDROcodone-homatropine [facility]) 5-1.5 MG/5ML syrup 5-7mL every 6 hours as needed for severe cough 05/01/18   Charlesetta Shanks, MD  lisinopril-hydrochlorothiazide (PRINZIDE,ZESTORETIC) 20-12.5 MG tablet Take 1 tablet by mouth daily. 06/21/17   [provider]  meclizine (ANTIVERT) 25 MG tablet Take 1 tablet (25 mg total) by mouth 3 (three) times daily as needed for dizziness. 06/24/17   Duffy Bruce, MD  omeprazole (  PRILOSEC) 20 MG capsule Take 20 mg by mouth daily. 06/01/17   [provider]  ondansetron (ZOFRAN) 8 MG tablet Take 8 mg by mouth every 8 (eight) hours as needed. 05/25/17   [provider]    Family History Family History  Family history unknown: Yes    Social History Social History   Tobacco Use  . Smoking status: Current Every Day Smoker    Packs/day: 0.00    Years: 34.00    Pack years: 0.00    Types: Cigarettes  . Smokeless tobacco: Never Used  . Tobacco comment: 5 cig./day  Substance Use Topics  . Alcohol use: No  . Drug use:  No     Allergies   Tramadol; Acetaminophen; and Aspirin   Review of Systems Review of Systems 10 Systems reviewed and are negative for acute change except as noted in the HPI.   Physical Exam Updated Vital Signs BP (!) 149/99 (BP Location: Right Arm)   Pulse 84   Temp 97.8 F (36.6 C) (Oral)   Resp 19   Ht 5\' 7"  (1.702 m)   Wt 81.6 kg   LMP 08/19/2012   SpO2 98%   BMI 28.19 kg/m   Physical Exam Constitutional:      Appearance: Normal appearance.  HENT:     Head: Normocephalic and atraumatic.     Mouth/Throat:     Comments: Tonsils are large.  No exudates.  Airway is widely patent.  Patient does have laryngitic voice. Eyes:     Extraocular Movements: Extraocular movements intact.     Conjunctiva/sclera: Conjunctivae normal.  Cardiovascular:     Comments: Heart regular with occasional ectopic beat.  No gross rub murmur gallop. Pulmonary:     Effort: Pulmonary effort is normal.     Breath sounds: Normal breath sounds.     Comments: Intermittent harsh cough. Abdominal:     General: There is no distension.     Palpations: Abdomen is soft.     Tenderness: There is no abdominal tenderness. There is no guarding.  Musculoskeletal: Normal range of motion.        General: No swelling or tenderness.     Right lower leg: No edema.     Left lower leg: No edema.  Skin:    General: Skin is warm and dry.  Neurological:     General: No focal deficit present.     Mental Status: She is alert and oriented to person, place, and time.     Coordination: Coordination normal.  Psychiatric:        Mood and Affect: Mood normal.      ED Treatments / Results  Labs (all labs ordered are listed, but only abnormal results are displayed) Labs Reviewed  GROUP A STREP BY PCR    EKG None  Radiology Dg Chest Port 1 View  Result Date: 05/01/2018 CLINICAL DATA:  Cough EXAM: PORTABLE CHEST 1 VIEW COMPARISON:  11/27/2015 FINDINGS: Heart and mediastinal contours are within normal  limits. No focal opacities or effusions. No acute bony abnormality. IMPRESSION: No active disease. Electronically Signed   By: Rolm Baptise M.D.   On: 05/01/2018 19:51    Procedures Procedures (including critical care time)  Medications Ordered in ED Medications  benzonatate (TESSALON) capsule 200 mg (200 mg Oral Given 05/01/18 2027)     Initial Impression / Assessment and Plan / ED Course  I have reviewed the triage vital signs and the nursing notes.  Pertinent labs & imaging results  that were available during my care of the patient were reviewed by me and considered in my medical decision making (see chart for details).       Monique Fowler was evaluated in Emergency Department on 05/01/2018 for the symptoms described in the history of present illness. She was evaluated in the context of the global COVID-19 pandemic, which necessitated consideration that the patient might be at risk for infection with the SARS-CoV-2 virus that causes COVID-19. Institutional protocols and algorithms that pertain to the evaluation of patients at risk for COVID-19 are in a state of rapid change based on information released by regulatory bodies including the CDC and federal and state organizations. These policies and algorithms were followed during the patient's care in the ED.  Patient presents with recurrent cough paroxysms and cough that has worsened over the past week now with yellow sputum.  Patient is a regular cigarette smoker.  She denies that she has had a documented fever.  Patient is not short of breath.  Her chest x-ray does not show infiltrates and she does not have hypoxia.  Patient is nontoxic.  Patient reports she has been home and does not work and have exposure to others or any known coronavirus exposure.  At this time, with indolent course over 3 weeks and patient not exhibiting hypoxia, infiltrates, worsening shortness of breath I have higher specimen for her typical bronchitis in a smoker then  coronavirus.  At this time, will opt to treat with Zithromax and hydrocodone.  Patient is stable for discharge.  She is counseled on maintaining social distancing protocols as all individuals with upper respiratory symptoms are potential for having coronavirus infection.  She voices awareness.  Discharge instructions include educational information.  Final Clinical Impressions(s) / ED Diagnoses   Final diagnoses:  Bronchitis  Laryngitis    ED Discharge Orders         Ordered    azithromycin (ZITHROMAX Z-PAK) 250 MG tablet  Status:  Discontinued     05/01/18 2134    HYDROcodone-homatropine (HYCODAN) 5-1.5 MG/5ML syrup  Status:  Discontinued     05/01/18 2134    benzonatate (TESSALON) 100 MG capsule  Every 8 hours     05/01/18 2134    azithromycin (ZITHROMAX Z-PAK) 250 MG tablet     05/01/18 2141    HYDROcodone-homatropine (HYCODAN) 5-1.5 MG/5ML syrup     05/01/18 2141           Charlesetta Shanks, MD 05/01/18 2146

## 2018-05-01 NOTE — ED Notes (Signed)
Patient continues to cough. MD made aware. Will administer ordered medications.

## 2018-05-01 NOTE — ED Notes (Signed)
XR at bedside

## 2018-05-25 ENCOUNTER — Other Ambulatory Visit: Payer: Self-pay

## 2018-05-25 ENCOUNTER — Emergency Department (HOSPITAL_COMMUNITY): Payer: Medicaid Other

## 2018-05-25 ENCOUNTER — Emergency Department (HOSPITAL_COMMUNITY)
Admission: EM | Admit: 2018-05-25 | Discharge: 2018-05-25 | Disposition: A | Discharge status: Home / Self Care | Payer: Medicaid Other | Attending: Emergency Medicine | Admitting: Emergency Medicine

## 2018-05-25 DIAGNOSIS — R0789 Other chest pain: Secondary | ICD-10-CM | POA: Diagnosis not present

## 2018-05-25 DIAGNOSIS — R1084 Generalized abdominal pain: Secondary | ICD-10-CM | POA: Insufficient documentation

## 2018-05-25 DIAGNOSIS — F17210 Nicotine dependence, cigarettes, uncomplicated: Secondary | ICD-10-CM | POA: Insufficient documentation

## 2018-05-25 DIAGNOSIS — Y9389 Activity, other specified: Secondary | ICD-10-CM | POA: Diagnosis not present

## 2018-05-25 DIAGNOSIS — Z23 Encounter for immunization: Secondary | ICD-10-CM | POA: Diagnosis not present

## 2018-05-25 DIAGNOSIS — S41111A Laceration without foreign body of right upper arm, initial encounter: Secondary | ICD-10-CM

## 2018-05-25 DIAGNOSIS — Z79899 Other long term (current) drug therapy: Secondary | ICD-10-CM | POA: Insufficient documentation

## 2018-05-25 DIAGNOSIS — Y92410 Unspecified street and highway as the place of occurrence of the external cause: Secondary | ICD-10-CM | POA: Insufficient documentation

## 2018-05-25 DIAGNOSIS — I10 Essential (primary) hypertension: Secondary | ICD-10-CM | POA: Diagnosis not present

## 2018-05-25 DIAGNOSIS — Y999 Unspecified external cause status: Secondary | ICD-10-CM | POA: Diagnosis not present

## 2018-05-25 DIAGNOSIS — J45909 Unspecified asthma, uncomplicated: Secondary | ICD-10-CM | POA: Diagnosis not present

## 2018-05-25 DIAGNOSIS — S51811A Laceration without foreign body of right forearm, initial encounter: Secondary | ICD-10-CM | POA: Diagnosis not present

## 2018-05-25 LAB — BASIC METABOLIC PANEL
Anion gap: 11 (ref 5–15)
BUN: 6 mg/dL (ref 6–20)
CO2: 22 mmol/L (ref 22–32)
Calcium: 8.9 mg/dL (ref 8.9–10.3)
Chloride: 106 mmol/L (ref 98–111)
Creatinine, Ser: 0.71 mg/dL (ref 0.44–1.00)
GFR calc Af Amer: >60 mL/min (ref >60)
GFR calc non Af Amer: >60 mL/min (ref >60)
Glucose, Bld: 96 mg/dL (ref 70–99)
Potassium: 4.1 mmol/L (ref 3.5–5.1)
Sodium: 139 mmol/L (ref 135–145)

## 2018-05-25 LAB — CBC WITH DIFFERENTIAL/PLATELET
Abs Immature Granulocytes: 0.05 10*3/uL (ref 0.00–0.07)
Basophils Absolute: 0.1 10*3/uL (ref 0.0–0.1)
Basophils Relative: 0 %
Eosinophils Absolute: 0.2 10*3/uL (ref 0.0–0.5)
Eosinophils Relative: 1 %
HCT: 42.2 % (ref 36.0–46.0)
Hemoglobin: 13.5 g/dL (ref 12.0–15.0)
Immature Granulocytes: 0 %
Lymphocytes Relative: 22 %
Lymphs Abs: 3.1 10*3/uL (ref 0.7–4.0)
MCH: 25.8 pg — ABNORMAL LOW (ref 26.0–34.0)
MCHC: 32 g/dL (ref 30.0–36.0)
MCV: 80.5 fL (ref 80.0–100.0)
Monocytes Absolute: 0.7 10*3/uL (ref 0.1–1.0)
Monocytes Relative: 5 %
Neutro Abs: 10.2 10*3/uL — ABNORMAL HIGH (ref 1.7–7.7)
Neutrophils Relative %: 72 %
Platelets: 233 10*3/uL (ref 150–400)
RBC: 5.24 MIL/uL — ABNORMAL HIGH (ref 3.87–5.11)
RDW: 16.9 % — ABNORMAL HIGH (ref 11.5–15.5)
WBC: 14.3 10*3/uL — ABNORMAL HIGH (ref 4.0–10.5)
nRBC: 0 % (ref 0.0–0.2)

## 2018-05-25 LAB — I-STAT BETA HCG BLOOD, ED (MC, WL, AP ONLY): I-stat hCG, quantitative: <5 m[IU]/mL (ref <5)

## 2018-05-25 MED ORDER — IOHEXOL 300 MG/ML  SOLN
100.0000 mL | Freq: Once | INTRAMUSCULAR | Status: AC | PRN
  Administered 2018-05-25: 100 mL via INTRAVENOUS

## 2018-05-25 MED ORDER — TETANUS-DIPHTH-ACELL PERTUSSIS 5-2.5-18.5 LF-MCG/0.5 IM SUSP
0.5000 mL | Freq: Once | INTRAMUSCULAR | Status: AC
  Administered 2018-05-25: 0.5 mL via INTRAMUSCULAR
  Filled 2018-05-25: qty 0.5

## 2018-05-25 MED ORDER — SODIUM CHLORIDE (PF) 0.9 % IJ SOLN
INTRAMUSCULAR | Status: AC
  Filled 2018-05-25: qty 50

## 2018-05-25 MED ORDER — OXYCODONE HCL 5 MG PO TABS
5.0000 mg | ORAL_TABLET | Freq: Once | ORAL | Status: AC
  Administered 2018-05-25: 5 mg via ORAL
  Filled 2018-05-25: qty 1

## 2018-05-25 NOTE — Discharge Instructions (Signed)
Please read instructions below.  Keep your wound clean and covered. In 24 hours, you can get your wound wet; gently clean it with soap and water, pat it dry, and reapply a clean bandage. Follow up with your primary care or urgent care for wound recheck in 7 days.  Apply ice to your areas of pain for 20 minutes at a time. Return to the ER for fever, pus draining from wound, redness, or new or worsening symptoms. Return to the ER for severely worsening headache, vision changes, if new numbness or tingling in your arms or legs, inability to urinate, inability to hold your bowels, or weakness in your extremities.
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
## 2018-08-22 ENCOUNTER — Other Ambulatory Visit: Payer: Self-pay | Admitting: Family

## 2018-08-22 DIAGNOSIS — Z1231 Encounter for screening mammogram for malignant neoplasm of breast: Secondary | ICD-10-CM

## 2018-08-26 ENCOUNTER — Ambulatory Visit: Payer: Medicaid Other

## 2018-09-28 ENCOUNTER — Ambulatory Visit
Admission: RE | Admit: 2018-09-28 | Discharge: 2018-09-28 | Disposition: A | Discharge status: Home / Self Care | Payer: Medicaid Other | Source: Ambulatory Visit | Attending: Family | Admitting: Family

## 2018-09-28 ENCOUNTER — Other Ambulatory Visit: Payer: Self-pay

## 2018-09-28 DIAGNOSIS — Z1231 Encounter for screening mammogram for malignant neoplasm of breast: Secondary | ICD-10-CM

## 2018-09-30 ENCOUNTER — Other Ambulatory Visit: Payer: Self-pay | Admitting: Oncology

## 2018-09-30 ENCOUNTER — Other Ambulatory Visit: Payer: Self-pay

## 2018-09-30 ENCOUNTER — Ambulatory Visit: Payer: Medicaid Other

## 2018-09-30 DIAGNOSIS — N6452 Nipple discharge: Secondary | ICD-10-CM

## 2018-10-07 ENCOUNTER — Other Ambulatory Visit: Payer: Medicaid Other

## 2018-10-07 ENCOUNTER — Ambulatory Visit: Payer: Medicaid Other

## 2018-10-17 ENCOUNTER — Other Ambulatory Visit: Payer: Medicaid Other

## 2018-10-19 ENCOUNTER — Ambulatory Visit
Admission: RE | Admit: 2018-10-19 | Discharge: 2018-10-19 | Disposition: A | Discharge status: Home / Self Care | Payer: Medicaid Other | Source: Ambulatory Visit | Attending: Oncology | Admitting: Oncology

## 2018-10-19 ENCOUNTER — Other Ambulatory Visit: Payer: Self-pay

## 2018-10-19 DIAGNOSIS — N6452 Nipple discharge: Secondary | ICD-10-CM

## 2019-03-31 ENCOUNTER — Ambulatory Visit: Payer: Medicaid Other

## 2020-11-11 ENCOUNTER — Ambulatory Visit (INDEPENDENT_AMBULATORY_CARE_PROVIDER_SITE_OTHER): Payer: Medicaid Other | Admitting: Podiatry

## 2020-11-11 ENCOUNTER — Ambulatory Visit: Payer: Medicaid Other

## 2020-11-11 ENCOUNTER — Encounter: Payer: Self-pay | Admitting: Podiatry

## 2020-11-11 ENCOUNTER — Other Ambulatory Visit: Payer: Self-pay

## 2020-11-11 DIAGNOSIS — M778 Other enthesopathies, not elsewhere classified: Secondary | ICD-10-CM

## 2020-11-11 DIAGNOSIS — M7750 Other enthesopathy of unspecified foot: Secondary | ICD-10-CM

## 2020-11-11 DIAGNOSIS — M7751 Other enthesopathy of right foot: Secondary | ICD-10-CM | POA: Diagnosis not present

## 2020-11-11 MED ORDER — TRIAMCINOLONE ACETONIDE 10 MG/ML IJ SUSP: 10.0000 mg | Freq: Once | INTRAMUSCULAR | Status: AC

## 2020-11-11 NOTE — Progress Notes (Signed)
Subjective:   Patient ID: Monique Fowler, female   DOB: 55 y.o.   MRN: [id number]   HPI Patient presents with pain in both feet on the outside with lesions on the right that she has trouble wearing shoe gear with.  Patient does not smoke pack a day and is not significantly active   Review of Systems  All other systems reviewed and are negative.      Objective:  Physical Exam Vitals and nursing note reviewed.  Constitutional:      Appearance: She is well-developed.  Pulmonary:     Effort: Pulmonary effort is normal.  Musculoskeletal:        General: Normal range of motion.  Skin:    General: Skin is warm.  Neurological:     Mental Status: She is alert.    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

## 2021-07-17 IMAGING — MG MM DIGITAL DIAGNOSTIC BILAT W/ TOMO W/ CAD
5 of 10 series · 5 of 30 positions shown · non-contrast
Comparison: Previous exam(s).
COMPARISON: Previous exam(s).

Addendum:
CLINICAL DATA: 53-year-old female with continued draining wound of
the LEFT breast following surgery 2 years ago for infection/abscess.
Also for annual bilateral mammogram.

EXAM:
DIGITAL DIAGNOSTIC BILATERAL MAMMOGRAM WITH CAD AND TOMO
ULTRASOUND LEFT BREAST

[L MLO synth-2D (1 of 2)]
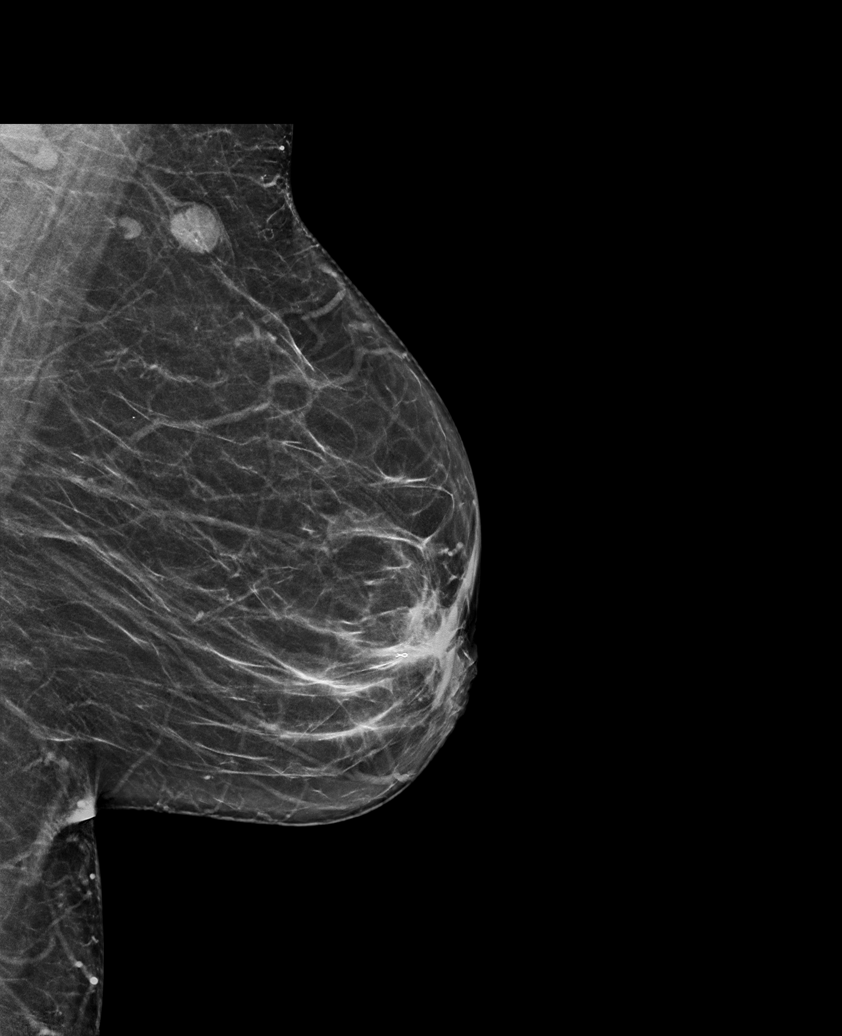

[R MLO synth-2D]
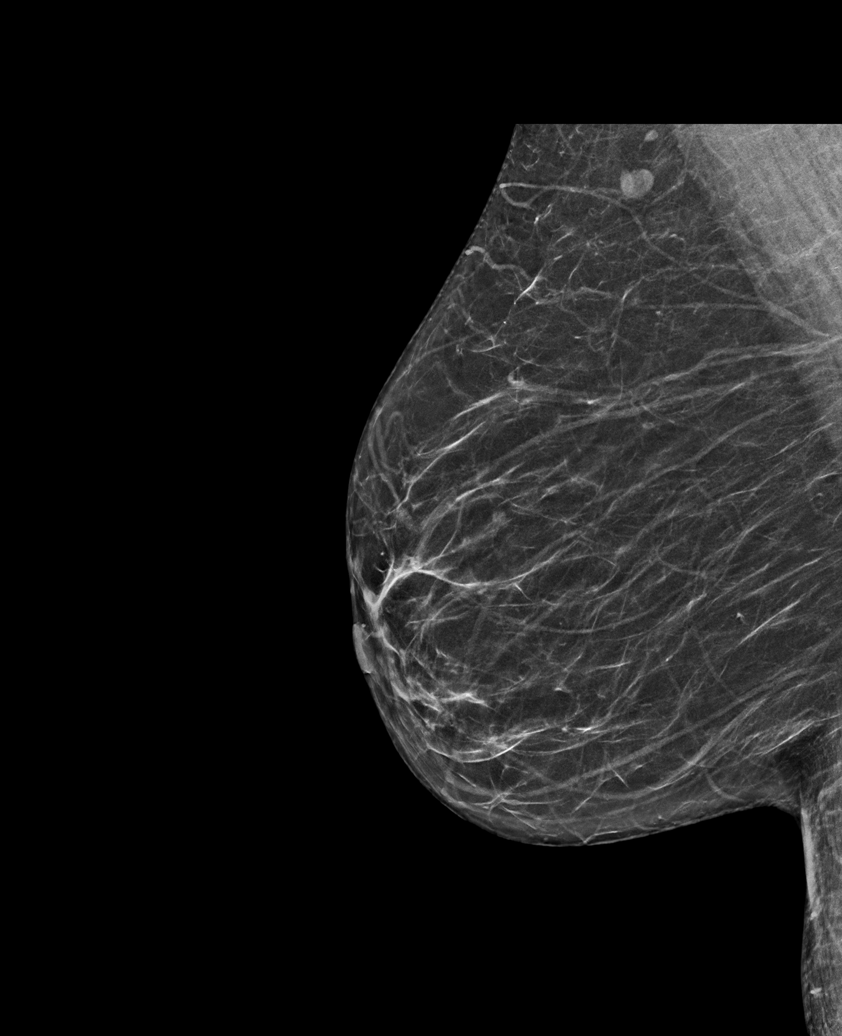

[R CC synth-2D]
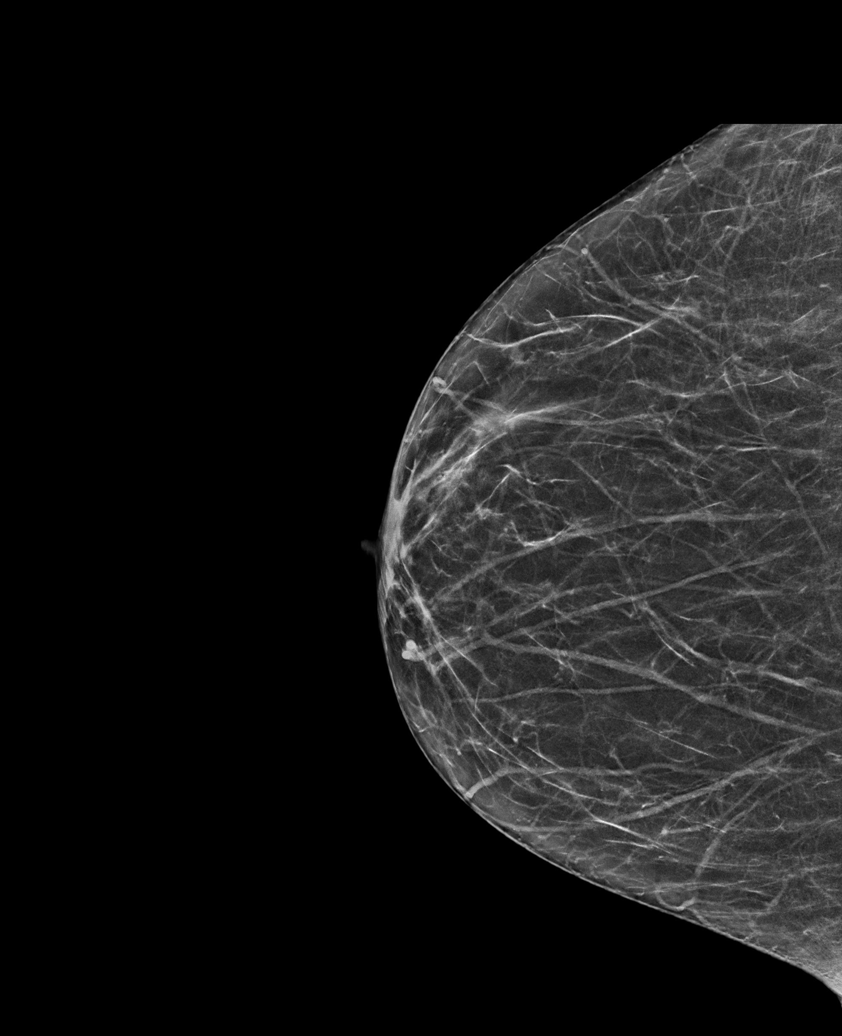

[L MLO synth-2D (2 of 2)]
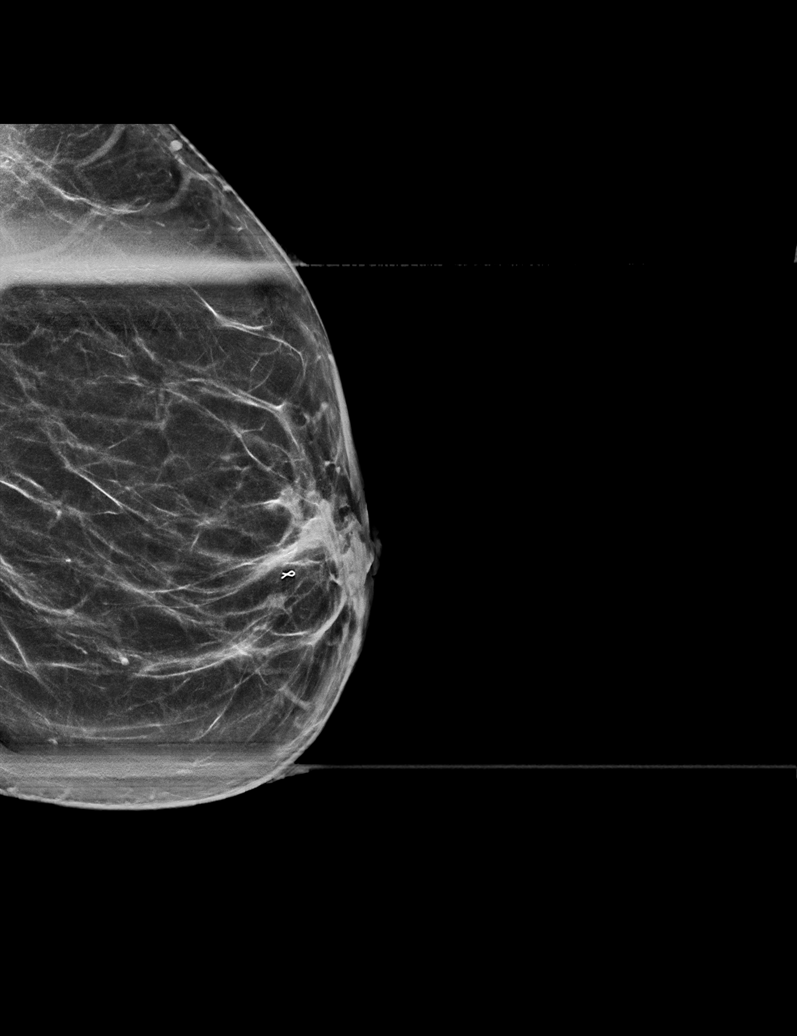

[L CC synth-2D]
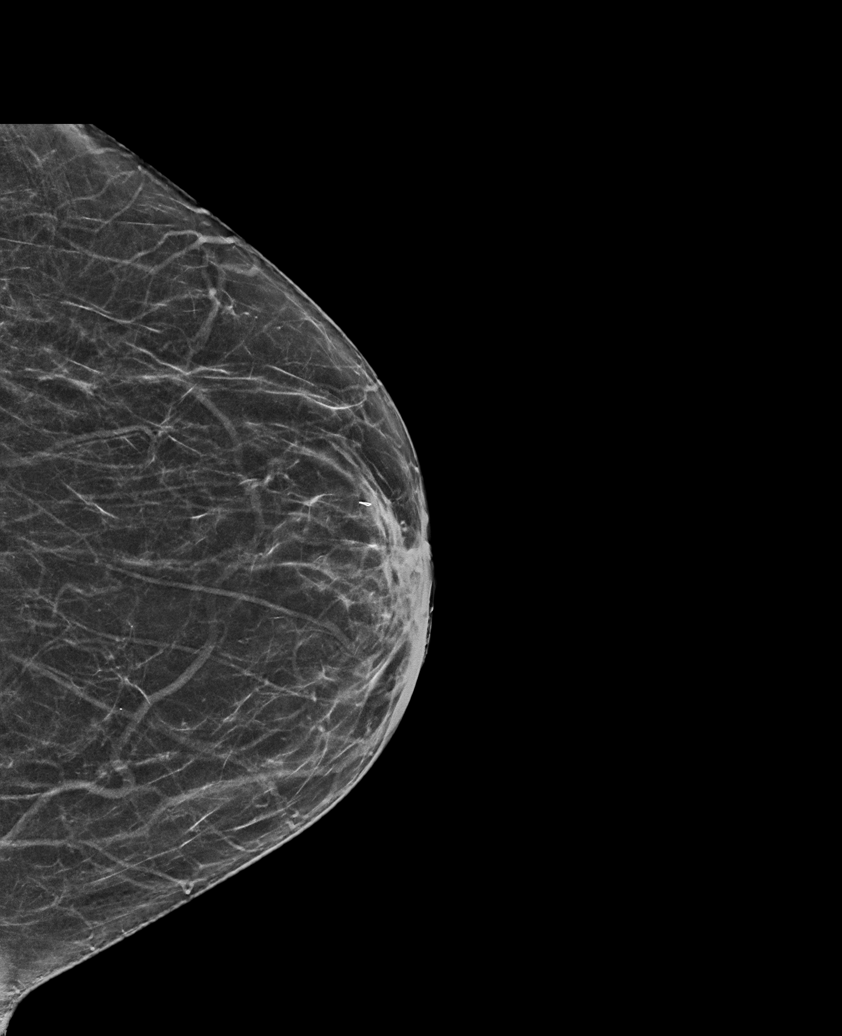

[5 of 30 positions shown; findings below may reference images not displayed]

ACR Breast Density Category b: There are scattered areas of
fibroglandular density.
FINDINGS: 2D/3D full field views of both breasts and spot compression view of
the LEFT breast demonstrate surgical changes and increased density
in the RETROAREOLAR LEFT breast.

Mammographic images were processed with CAD.

On physical exam, surgical changes and open wound at the UPPER INNER
LEFT areola.

Targeted ultrasound is performed, showing a small amount of
ill-defined fluid underlying the surgical changes with connection to
the skin. The fluid measures up to 0.9 x 0.4 x 0.7 cm.

No suspicious mass, contained abscess or worrisome shadowing noted.
IMPRESSION: 1. UPPER INNER LEFT areolar surgical changes with small ill-defined
fluid collection draining to the skin. No contained
collection/abscess. The patient desires surgical consultation for
further evaluation.
2. No mammographic evidence of breast malignancy.

RECOMMENDATION:
Surgical consultation per patient request, which will be arranged.

Bilateral screening mammogram in 1 year.

I have discussed the findings and recommendations with the patient.
If applicable, a reminder letter will be sent to the patient
regarding the next appointment.

BI-RADS CATEGORY  2: Benign.

ADDENDUM:
Surgical consultation has been arranged with Dr. Fely Bacchus at
[REDACTED] on November 08, 2018.

Kotomi Jolie, RN on 10/20/2018.

*** End of Addendum ***
ACR Breast Density Category b: There are scattered areas of
fibroglandular density.
FINDINGS: 2D/3D full field views of both breasts and spot compression view of
the LEFT breast demonstrate surgical changes and increased density
in the RETROAREOLAR LEFT breast.

Mammographic images were processed with CAD.

On physical exam, surgical changes and open wound at the UPPER INNER
LEFT areola.

Targeted ultrasound is performed, showing a small amount of
ill-defined fluid underlying the surgical changes with connection to
the skin. The fluid measures up to 0.9 x 0.4 x 0.7 cm.

No suspicious mass, contained abscess or worrisome shadowing noted.
IMPRESSION: 1. UPPER INNER LEFT areolar surgical changes with small ill-defined
fluid collection draining to the skin. No contained
collection/abscess. The patient desires surgical consultation for
further evaluation.
2. No mammographic evidence of breast malignancy.

RECOMMENDATION:
Surgical consultation per patient request, which will be arranged.

Bilateral screening mammogram in 1 year.

I have discussed the findings and recommendations with the patient.
If applicable, a reminder letter will be sent to the patient
regarding the next appointment.

BI-RADS CATEGORY  2: Benign.

## 2021-08-27 ENCOUNTER — Other Ambulatory Visit: Payer: Self-pay | Admitting: Anesthesiology

## 2021-08-27 DIAGNOSIS — M545 Low back pain, unspecified: Secondary | ICD-10-CM

## 2021-08-27 DIAGNOSIS — M5030 Other cervical disc degeneration, unspecified cervical region: Secondary | ICD-10-CM

## 2021-09-08 ENCOUNTER — Ambulatory Visit
Admission: RE | Admit: 2021-09-08 | Discharge: 2021-09-08 | Disposition: A | Discharge status: Home / Self Care | Payer: Medicaid Other | Source: Ambulatory Visit | Attending: Anesthesiology | Admitting: Anesthesiology

## 2021-09-08 DIAGNOSIS — M5030 Other cervical disc degeneration, unspecified cervical region: Secondary | ICD-10-CM

## 2021-09-08 DIAGNOSIS — M545 Low back pain, unspecified: Secondary | ICD-10-CM

## 2021-10-24 ENCOUNTER — Institutional Professional Consult (permissible substitution): Payer: Medicaid Other | Admitting: Pulmonary Disease

## 2021-11-11 ENCOUNTER — Institutional Professional Consult (permissible substitution): Payer: Medicaid Other | Admitting: Pulmonary Disease

## 2021-11-14 ENCOUNTER — Institutional Professional Consult (permissible substitution): Payer: Medicaid Other | Admitting: Pulmonary Disease

## 2021-12-03 ENCOUNTER — Institutional Professional Consult (permissible substitution): Payer: Medicaid Other | Admitting: Pulmonary Disease

## 2021-12-03 NOTE — Progress Notes (Deleted)
Synopsis: Referred in November 2023 for possible COPD  Subjective:   PATIENT ID: Monique Fowler GENDER: female DOB: 03-23-1965, MRN: [id number]   HPI  No chief complaint on file.   *** Record review: November 21, 2021 primary care notes reviewed where the patient was seen in the context of hypertension, tobacco use and other medical problems.  She was counseled to quit smoking.  Referred to pulmonary.  Past Medical History:  Diagnosis Date   Arthritis    right knee   Asthma    prn inhaler   Bipolar affective ([place])    Breast mass, left 04/2016   Depression    GERD (gastroesophageal reflux disease)    no current med.   Hypertension    states BP "sometimes" under control with med., has been on med. x 3 yr.   Wears partial dentures    upper     Family History  Family history unknown: Yes     Social History   Socioeconomic History   Marital status: Divorced    Spouse name: Not on file   Number of children: Not on file   Years of education: Not on file   Highest education level: Not on file  Occupational History   Not on file  Tobacco Use   Smoking status: Every Day    Packs/day: 0.00    Years: 34.00    Total pack years: 0.00    Types: Cigarettes   Smokeless tobacco: Never   Tobacco comments:    5 cig./day  Substance and Sexual Activity   Alcohol use: No   Drug use: No   Sexual activity: Not on file  Other Topics Concern   Not on file  Social History Narrative   Not on file   Social Determinants of Health   Financial Resource Strain: Not on file  Food Insecurity: Not on file  Transportation Needs: Not on file  Physical Activity: Not on file  Stress: Not on file  Social Connections: Not on file  Intimate Partner Violence: Not on file     Allergies  Allergen Reactions   Tramadol Nausea And Vomiting   Acetaminophen Nausea Only   Aspirin Nausea Only     Outpatient Medications Prior to Visit  Medication Sig Dispense Refill   albuterol  (PROVENTIL HFA;VENTOLIN HFA) 108 (90 Base) MCG/ACT inhaler Inhale 1-2 puffs into the lungs every 6 (six) hours as needed for wheezing or shortness of breath.     allopurinol (ZYLOPRIM) 300 MG tablet Take 300 mg by mouth daily.     ammonium lactate (LAC-HYDRIN) 12 % lotion Apply 1 application topically daily.     atorvastatin (LIPITOR) 20 MG tablet Take 20 mg by mouth daily.     azithromycin (ZITHROMAX Z-PAK) 250 MG tablet 2 po day one, then 1 daily x 4 days (Patient not taking: Reported on 05/25/2018) 5 tablet 0   benzonatate (TESSALON) 100 MG capsule Take 1 capsule (100 mg total) by mouth every 8 (eight) hours. (Patient not taking: Reported on 05/25/2018) 21 capsule 0   ciclopirox (PENLAC) 8 % solution Apply topically at bedtime.     fluticasone (FLONASE) 50 MCG/ACT nasal spray Place 2 sprays into both nostrils daily. (Patient not taking: Reported on 05/25/2018) 16 g 0   HYDROcodone-homatropine (HYCODAN) 5-1.5 MG/5ML syrup 5-56m every 6 hours as needed for severe cough (Patient not taking: Reported on 05/25/2018) 120 mL 0   lisinopril (ZESTRIL) 20 MG tablet Take 20 mg by mouth daily.  lisinopril-hydrochlorothiazide (PRINZIDE,ZESTORETIC) 20-12.5 MG tablet Take 1 tablet by mouth daily.  2   meclizine (ANTIVERT) 25 MG tablet Take 1 tablet (25 mg total) by mouth 3 (three) times daily as needed for dizziness. (Patient not taking: Reported on 05/25/2018) 30 tablet 0   omeprazole (PRILOSEC) 20 MG capsule Take 20 mg by mouth daily.  3   oxyCODONE-acetaminophen (PERCOCET) 10-325 MG tablet Take 1 tablet by mouth every 4 (four) hours as needed for pain.     SYMBICORT 160-4.5 MCG/ACT inhaler Inhale 2 puffs into the lungs 2 (two) times daily.     Facility-Administered Medications Prior to Visit  Medication Dose Route Frequency Provider Last Rate Last Admin   triamcinolone acetonide (KENALOG) 10 MG/ML injection 10 mg  10 mg Other Once Regal, Norman S, DPM        ROS    Objective:  Physical  Exam   There were no vitals filed for this visit.  ***  CBC    Component Value Date/Time   WBC 14.3 (H) 05/25/2018 2045   RBC 5.24 (H) 05/25/2018 2045   HGB 13.5 05/25/2018 2045   HGB 13.5 07/06/2012 1012   HCT 42.2 05/25/2018 2045   HCT 40.0 07/06/2012 1012   PLT 233 05/25/2018 2045   PLT 242 07/06/2012 1012   MCV 80.5 05/25/2018 2045   MCV 81 07/06/2012 1012   MCH 25.8 (L) 05/25/2018 2045   MCHC 32.0 05/25/2018 2045   RDW 16.9 (H) 05/25/2018 2045   RDW 15.6 (H) 07/06/2012 1012   LYMPHSABS 3.1 05/25/2018 2045   MONOABS 0.7 05/25/2018 2045   EOSABS 0.2 05/25/2018 2045   BASOSABS 0.1 05/25/2018 2045     Chest imaging: May 2020 chest x-ray 2 view images independently reviewed showing normal pulmonary parenchyma, normal cardiac silhouette, no active cardiopulmonary disease  PFT:  Labs:  Path:  Echo:  Heart Catheterization:       Assessment & Plan:   No diagnosis found.  Discussion: ***  Immunizations: Immunization History  Administered Date(s) Administered   Pneumococcal Polysaccharide-23 09/18/2015   Tdap 05/25/2018     Current Outpatient Medications:    albuterol (PROVENTIL HFA;VENTOLIN HFA) 108 (90 Base) MCG/ACT inhaler, Inhale 1-2 puffs into the lungs every 6 (six) hours as needed for wheezing or shortness of breath., Disp: , Rfl:    allopurinol (ZYLOPRIM) 300 MG tablet, Take 300 mg by mouth daily., Disp: , Rfl:    ammonium lactate (LAC-HYDRIN) 12 % lotion, Apply 1 application topically daily., Disp: , Rfl:    atorvastatin (LIPITOR) 20 MG tablet, Take 20 mg by mouth daily., Disp: , Rfl:    azithromycin (ZITHROMAX Z-PAK) 250 MG tablet, 2 po day one, then 1 daily x 4 days (Patient not taking: Reported on 05/25/2018), Disp: 5 tablet, Rfl: 0   benzonatate (TESSALON) 100 MG capsule, Take 1 capsule (100 mg total) by mouth every 8 (eight) hours. (Patient not taking: Reported on 05/25/2018), Disp: 21 capsule, Rfl: 0   ciclopirox (PENLAC) 8 % solution, Apply  topically at bedtime., Disp: , Rfl:    fluticasone (FLONASE) 50 MCG/ACT nasal spray, Place 2 sprays into both nostrils daily. (Patient not taking: Reported on 05/25/2018), Disp: 16 g, Rfl: 0   HYDROcodone-homatropine (HYCODAN) 5-1.5 MG/5ML syrup, 5-15m every 6 hours as needed for severe cough (Patient not taking: Reported on 05/25/2018), Disp: 120 mL, Rfl: 0   lisinopril (ZESTRIL) 20 MG tablet, Take 20 mg by mouth daily., Disp: , Rfl:    lisinopril-hydrochlorothiazide (PRINZIDE,ZESTORETIC) 20-12.5 MG tablet,  Take 1 tablet by mouth daily., Disp: , Rfl: 2   meclizine (ANTIVERT) 25 MG tablet, Take 1 tablet (25 mg total) by mouth 3 (three) times daily as needed for dizziness. (Patient not taking: Reported on 05/25/2018), Disp: 30 tablet, Rfl: 0   omeprazole (PRILOSEC) 20 MG capsule, Take 20 mg by mouth daily., Disp: , Rfl: 3   oxyCODONE-acetaminophen (PERCOCET) 10-325 MG tablet, Take 1 tablet by mouth every 4 (four) hours as needed for pain., Disp: , Rfl:    SYMBICORT 160-4.5 MCG/ACT inhaler, Inhale 2 puffs into the lungs 2 (two) times daily., Disp: , Rfl:   Current Facility-Administered Medications:    triamcinolone acetonide (KENALOG) 10 MG/ML injection 10 mg, 10 mg, Other, Once, Regal, Tamala Fothergill, DPM

## 2021-12-09 ENCOUNTER — Institutional Professional Consult (permissible substitution): Payer: Medicaid Other | Admitting: Pulmonary Disease

## 2021-12-15 ENCOUNTER — Ambulatory Visit (HOSPITAL_COMMUNITY): Payer: Medicaid Other | Admitting: Clinical

## 2021-12-15 NOTE — Progress Notes (Deleted)
Psychiatric Initial Adult Assessment  Patient Identification: Monique Fowler MRN:  [id number] Date of Evaluation:  12/15/2021 Referral Source: [facility]  Assessment:  Monique Fowler is a 56 y.o. female with a history of cocaine use disorder, alcohol use disorder; substance induced mood disorder, HTN, COPD, degenerative disc disease and low back pain on chronic opioids*** who presents in person to [facility] for initial evaluation of ***.  Patient reports ***  Plan:  # *** Past medication trials:  Status of problem: *** Interventions: -- ***  # *** Past medication trials:  Status of problem: *** Interventions: -- ***  # *** Past medication trials:  Status of problem: *** Interventions: -- ***  Patient was given contact information for behavioral health clinic and was instructed to call 911 for emergencies.   Subjective:  Chief Complaint: No chief complaint on file.   History of Present Illness:  ***   Propranolol 10 mg BID PRN anxiety Celexa 10 mg daily (or 40?)   Consider cymbalta   PDMP: -- Oxycodone-acetaminophen 10-325 QTY 120 last filled 12/09/21   Past Psychiatric History:  Diagnoses: ***cocaine use disorder, alcohol use disorder; substance induced mood disorder Medication trials: ***Abilify, Remeron, Celexa, Atarax, trazodone Previous psychiatrist/therapist: *** Hospitalizations: ***July 2017 for SI in setting of cocaine and etoh use***; June 2016 for *** Suicide attempts: *** SIB: *** Hx of violence towards others: *** Current access to guns: *** Hx of abuse: ***  Substance Abuse History in the last 12 months:  {yes no:314532}  Past Medical History:  Past Medical History:  Diagnosis Date   Arthritis    right knee   Asthma    prn inhaler   Bipolar affective ([place])    Breast mass, left 04/2016   Depression    GERD (gastroesophageal reflux disease)    no current med.   Hypertension     states BP "sometimes" under control with med., has been on med. x 3 yr.   Wears partial dentures    upper    Past Surgical History:  Procedure Laterality Date   BREAST BIOPSY Left 01/16/2016   CESAREAN SECTION     KNEE ARTHROSCOPY Right    KNEE SURGERY      Family Psychiatric History: ***  Family History:  Family History  Family history unknown: Yes    Social History:   Social History   Socioeconomic History   Marital status: Divorced    Spouse name: Not on file   Number of children: Not on file   Years of education: Not on file   Highest education level: Not on file  Occupational History   Not on file  Tobacco Use   Smoking status: Every Day    Packs/day: 0.00    Years: 34.00    Total pack years: 0.00    Types: Cigarettes   Smokeless tobacco: Never   Tobacco comments:    5 cig./day  Substance and Sexual Activity   Alcohol use: No   Drug use: No   Sexual activity: Not on file  Other Topics Concern   Not on file  Social History Narrative   Not on file   Social Determinants of Health   Financial Resource Strain: Not on file  Food Insecurity: Not on file  Transportation Needs: Not on file  Physical Activity: Not on file  Stress: Not on file  Social Connections: Not on file    Additional Social History: ***  Allergies:   Allergies  Allergen Reactions   Tramadol Nausea And Vomiting   Acetaminophen Nausea Only   Aspirin Nausea Only    Current Medications: Current Outpatient Medications  Medication Sig Dispense Refill   albuterol (PROVENTIL HFA;VENTOLIN HFA) 108 (90 Base) MCG/ACT inhaler Inhale 1-2 puffs into the lungs every 6 (six) hours as needed for wheezing or shortness of breath.     allopurinol (ZYLOPRIM) 300 MG tablet Take 300 mg by mouth daily.     ammonium lactate (LAC-HYDRIN) 12 % lotion Apply 1 application topically daily.     atorvastatin (LIPITOR) 20 MG tablet Take 20 mg by mouth daily.     azithromycin (ZITHROMAX Z-PAK) 250 MG tablet 2  po day one, then 1 daily x 4 days (Patient not taking: Reported on 05/25/2018) 5 tablet 0   benzonatate (TESSALON) 100 MG capsule Take 1 capsule (100 mg total) by mouth every 8 (eight) hours. (Patient not taking: Reported on 05/25/2018) 21 capsule 0   ciclopirox (PENLAC) 8 % solution Apply topically at bedtime.     fluticasone (FLONASE) 50 MCG/ACT nasal spray Place 2 sprays into both nostrils daily. (Patient not taking: Reported on 05/25/2018) 16 g 0   HYDROcodone-homatropine (HYCODAN) 5-1.5 MG/5ML syrup 5-65m every 6 hours as needed for severe cough (Patient not taking: Reported on 05/25/2018) 120 mL 0   lisinopril (ZESTRIL) 20 MG tablet Take 20 mg by mouth daily.     lisinopril-hydrochlorothiazide (PRINZIDE,ZESTORETIC) 20-12.5 MG tablet Take 1 tablet by mouth daily.  2   meclizine (ANTIVERT) 25 MG tablet Take 1 tablet (25 mg total) by mouth 3 (three) times daily as needed for dizziness. (Patient not taking: Reported on 05/25/2018) 30 tablet 0   omeprazole (PRILOSEC) 20 MG capsule Take 20 mg by mouth daily.  3   oxyCODONE-acetaminophen (PERCOCET) 10-325 MG tablet Take 1 tablet by mouth every 4 (four) hours as needed for pain.     SYMBICORT 160-4.5 MCG/ACT inhaler Inhale 2 puffs into the lungs 2 (two) times daily.     Current Facility-Administered Medications  Medication Dose Route Frequency Provider Last Rate Last Admin   triamcinolone acetonide (KENALOG) 10 MG/ML injection 10 mg  10 mg Other Once RWallene Huh DPM        ROS: Review of Systems  Objective:  Psychiatric Specialty Exam: Last menstrual period 08/13/2012.There is no height or weight on file to calculate BMI.  General Appearance: {Appearance:22683}  Eye Contact:  {BHH EYE CONTACT:22684}  Speech:  {Speech:22685}  Volume:  {Volume (PAA):22686}  Mood:  {BHH MOOD:22306}  Affect:  {Affect (PAA):22687}  Thought Content: {Thought Content:22690}   Suicidal Thoughts:  {ST/HT (PAA):22692}  Homicidal Thoughts:  {ST/HT (PAA):22692}   Thought Process:  {Thought Process (PAA):22688}  Orientation:  {BHH ORIENTATION (PAA):22689}    Memory:  {BHH MEMORY:22881}  Judgment:  {Judgement (PAA):22694}  Insight:  {Insight (PAA):22695}  Concentration:  {Concentration:21399}  Recall:  {BHH GOOD/FAIR/POOR:22877}  Fund of Knowledge: {BHH GOOD/FAIR/POOR:22877}  Language: {BHH GOOD/FAIR/POOR:22877}  Psychomotor Activity:  {Psychomotor (PAA):22696}  Akathisia:  {BHH YES OR NO:22294}  AIMS (if indicated): {Desc; done/not:10129}  Assets:  {Assets (PAA):22698}  ADL's:  {BHH AOXB'D:53299} Cognition: {chl bhh cognition:304700322}  Sleep:  {BHH GOOD/FAIR/POOR:22877}   PE: General: well-appearing; no acute distress *** Pulm: no increased work of breathing on room air *** Strength & Muscle Tone: {desc; muscle tone:32375} Neuro: no focal neurological deficits observed *** Gait & Station: {PE GAIT ED NMEQA:83419} Metabolic Disorder Labs: Lab Results  Component Value Date   HGBA1C 6.2 (H) 09/18/2015  MPG 131 09/18/2015   Lab Results  Component Value Date   PROLACTIN 8.2 09/18/2015   Lab Results  Component Value Date   CHOL 255 (H) 09/18/2015   TRIG 190 (H) 09/18/2015   HDL 48 09/18/2015   CHOLHDL 5.3 09/18/2015   VLDL 38 09/18/2015   LDLCALC 169 (H) 09/18/2015   Lab Results  Component Value Date   TSH 2.074 09/18/2015    Therapeutic Level Labs: No results found for: "LITHIUM" No results found for: "CBMZ" No results found for: "VALPROATE"  Screenings:  [place] Admission (Discharged) from 09/16/2015 in [facility]  AIMS Total Score 0      AUDIT    Flowsheet Row Admission (Discharged) from 09/16/2015 in [facility] Admission (Discharged) from 07/09/2014 in [facility]  Alcohol Use Disorder Identification Test Final Score (AUDIT) 2 28      Flowsheet Row ED from 05/25/2018 in [facility] DE[facility]  C-SSRS RISK CATEGORY No Risk       Collaboration of Care: Collaboration of Care: {BH OP Collaboration of Care:21014065}  Patient/Guardian was advised Release of Information must be obtained prior to any record release in order to collaborate their care with an outside provider. Patient/Guardian was advised if they have not already done so to contact the registration department to sign all necessary forms in order for [place] to release information regarding their care.   Consent: Patient/Guardian gives verbal consent for treatment and assignment of benefits for services provided during this visit. Patient/Guardian expressed understanding and agreed to proceed.   A total of *** minutes was spent involved in face to face clinical care, chart review, documentation, and ***.   Chasty Randal A  12/4/20235:20 PM

## 2021-12-15 NOTE — Patient Instructions (Incomplete)
Thank you for attending your appointment today.  -- *** -- Continue other medications as prescribed.  Please do not make any changes to medications without first discussing with your provider. If you are experiencing a psychiatric emergency, please call 911 or present to your nearest emergency department. Additional crisis, medication management, and therapy resources are included below.  [facility]  [address], [place], [place] [address] [phone] WALK-IN URGENT CARE 24/7 FOR ANYONE [address], [place], [place]  [phone] Fax: [phone] [email or web address] *Interpreters available *Accepts all insurance and uninsured for Urgent Care needs *Accepts Medicaid and uninsured for outpatient treatment (below)      ONLY FOR [place] Residents  Below:    Outpatient New Patient Assessment/Therapy Walk-ins:        Monday -Thursday 8am until slots are full.        Every Friday 1pm-4pm  (first come, first served)                   New Patient Psychiatry/Medication Management        Monday-Friday 8am-11am (first come, first served)               For all walk-ins we ask that you arrive by 7:15am, because patients will be seen in the order of arrival.   

## 2021-12-16 ENCOUNTER — Ambulatory Visit (HOSPITAL_COMMUNITY): Payer: Self-pay | Admitting: Psychiatry

## 2021-12-16 NOTE — Progress Notes (Deleted)
Synopsis: Referred in November 2023 for possible COPD  Subjective:   PATIENT ID: Monique Fowler GENDER: female DOB: Sep 19, 1965, MRN: [id number]   HPI  No chief complaint on file.   *** Record review: November 21, 2021 primary care notes reviewed where the patient was seen in the context of hypertension, tobacco use and other medical problems.  She was counseled to quit smoking.  Referred to pulmonary.  Past Medical History:  Diagnosis Date   Arthritis    right knee   Asthma    prn inhaler   Bipolar affective ([place])    Breast mass, left 04/2016   Depression    GERD (gastroesophageal reflux disease)    no current med.   Hypertension    states BP "sometimes" under control with med., has been on med. x 3 yr.   Wears partial dentures    upper     Family History  Family history unknown: Yes     Social History   Socioeconomic History   Marital status: Divorced    Spouse name: Not on file   Number of children: Not on file   Years of education: Not on file   Highest education level: Not on file  Occupational History   Not on file  Tobacco Use   Smoking status: Every Day    Packs/day: 0.00    Years: 34.00    Total pack years: 0.00    Types: Cigarettes   Smokeless tobacco: Never   Tobacco comments:    5 cig./day  Substance and Sexual Activity   Alcohol use: No   Drug use: No   Sexual activity: Not on file  Other Topics Concern   Not on file  Social History Narrative   Not on file   Social Determinants of Health   Financial Resource Strain: Not on file  Food Insecurity: Not on file  Transportation Needs: Not on file  Physical Activity: Not on file  Stress: Not on file  Social Connections: Not on file  Intimate Partner Violence: Not on file     Allergies  Allergen Reactions   Tramadol Nausea And Vomiting   Acetaminophen Nausea Only   Aspirin Nausea Only     Outpatient Medications Prior to Visit  Medication Sig Dispense Refill   albuterol  (PROVENTIL HFA;VENTOLIN HFA) 108 (90 Base) MCG/ACT inhaler Inhale 1-2 puffs into the lungs every 6 (six) hours as needed for wheezing or shortness of breath.     allopurinol (ZYLOPRIM) 300 MG tablet Take 300 mg by mouth daily.     ammonium lactate (LAC-HYDRIN) 12 % lotion Apply 1 application topically daily.     atorvastatin (LIPITOR) 20 MG tablet Take 20 mg by mouth daily.     azithromycin (ZITHROMAX Z-PAK) 250 MG tablet 2 po day one, then 1 daily x 4 days (Patient not taking: Reported on 05/25/2018) 5 tablet 0   benzonatate (TESSALON) 100 MG capsule Take 1 capsule (100 mg total) by mouth every 8 (eight) hours. (Patient not taking: Reported on 05/25/2018) 21 capsule 0   ciclopirox (PENLAC) 8 % solution Apply topically at bedtime.     fluticasone (FLONASE) 50 MCG/ACT nasal spray Place 2 sprays into both nostrils daily. (Patient not taking: Reported on 05/25/2018) 16 g 0   HYDROcodone-homatropine (HYCODAN) 5-1.5 MG/5ML syrup 5-39m every 6 hours as needed for severe cough (Patient not taking: Reported on 05/25/2018) 120 mL 0   lisinopril (ZESTRIL) 20 MG tablet Take 20 mg by mouth daily.  lisinopril-hydrochlorothiazide (PRINZIDE,ZESTORETIC) 20-12.5 MG tablet Take 1 tablet by mouth daily.  2   meclizine (ANTIVERT) 25 MG tablet Take 1 tablet (25 mg total) by mouth 3 (three) times daily as needed for dizziness. (Patient not taking: Reported on 05/25/2018) 30 tablet 0   omeprazole (PRILOSEC) 20 MG capsule Take 20 mg by mouth daily.  3   oxyCODONE-acetaminophen (PERCOCET) 10-325 MG tablet Take 1 tablet by mouth every 4 (four) hours as needed for pain.     SYMBICORT 160-4.5 MCG/ACT inhaler Inhale 2 puffs into the lungs 2 (two) times daily.     Facility-Administered Medications Prior to Visit  Medication Dose Route Frequency Provider Last Rate Last Admin   triamcinolone acetonide (KENALOG) 10 MG/ML injection 10 mg  10 mg Other Once Regal, Norman S, DPM        ROS    Objective:  Physical  Exam   There were no vitals filed for this visit.  ***  CBC    Component Value Date/Time   WBC 14.3 (H) 05/25/2018 2045   RBC 5.24 (H) 05/25/2018 2045   HGB 13.5 05/25/2018 2045   HGB 13.5 07/06/2012 1012   HCT 42.2 05/25/2018 2045   HCT 40.0 07/06/2012 1012   PLT 233 05/25/2018 2045   PLT 242 07/06/2012 1012   MCV 80.5 05/25/2018 2045   MCV 81 07/06/2012 1012   MCH 25.8 (L) 05/25/2018 2045   MCHC 32.0 05/25/2018 2045   RDW 16.9 (H) 05/25/2018 2045   RDW 15.6 (H) 07/06/2012 1012   LYMPHSABS 3.1 05/25/2018 2045   MONOABS 0.7 05/25/2018 2045   EOSABS 0.2 05/25/2018 2045   BASOSABS 0.1 05/25/2018 2045     Chest imaging: May 2020 chest x-ray 2 view images independently reviewed showing normal pulmonary parenchyma, normal cardiac silhouette, no active cardiopulmonary disease  PFT:  Labs:  Path:  Echo:  Heart Catheterization:       Assessment & Plan:   No diagnosis found.  Discussion: ***  Immunizations: Immunization History  Administered Date(s) Administered   Pneumococcal Polysaccharide-23 09/18/2015   Tdap 05/25/2018     Current Outpatient Medications:    albuterol (PROVENTIL HFA;VENTOLIN HFA) 108 (90 Base) MCG/ACT inhaler, Inhale 1-2 puffs into the lungs every 6 (six) hours as needed for wheezing or shortness of breath., Disp: , Rfl:    allopurinol (ZYLOPRIM) 300 MG tablet, Take 300 mg by mouth daily., Disp: , Rfl:    ammonium lactate (LAC-HYDRIN) 12 % lotion, Apply 1 application topically daily., Disp: , Rfl:    atorvastatin (LIPITOR) 20 MG tablet, Take 20 mg by mouth daily., Disp: , Rfl:    azithromycin (ZITHROMAX Z-PAK) 250 MG tablet, 2 po day one, then 1 daily x 4 days (Patient not taking: Reported on 05/25/2018), Disp: 5 tablet, Rfl: 0   benzonatate (TESSALON) 100 MG capsule, Take 1 capsule (100 mg total) by mouth every 8 (eight) hours. (Patient not taking: Reported on 05/25/2018), Disp: 21 capsule, Rfl: 0   ciclopirox (PENLAC) 8 % solution, Apply  topically at bedtime., Disp: , Rfl:    fluticasone (FLONASE) 50 MCG/ACT nasal spray, Place 2 sprays into both nostrils daily. (Patient not taking: Reported on 05/25/2018), Disp: 16 g, Rfl: 0   HYDROcodone-homatropine (HYCODAN) 5-1.5 MG/5ML syrup, 5-46m every 6 hours as needed for severe cough (Patient not taking: Reported on 05/25/2018), Disp: 120 mL, Rfl: 0   lisinopril (ZESTRIL) 20 MG tablet, Take 20 mg by mouth daily., Disp: , Rfl:    lisinopril-hydrochlorothiazide (PRINZIDE,ZESTORETIC) 20-12.5 MG tablet,  Take 1 tablet by mouth daily., Disp: , Rfl: 2   meclizine (ANTIVERT) 25 MG tablet, Take 1 tablet (25 mg total) by mouth 3 (three) times daily as needed for dizziness. (Patient not taking: Reported on 05/25/2018), Disp: 30 tablet, Rfl: 0   omeprazole (PRILOSEC) 20 MG capsule, Take 20 mg by mouth daily., Disp: , Rfl: 3   oxyCODONE-acetaminophen (PERCOCET) 10-325 MG tablet, Take 1 tablet by mouth every 4 (four) hours as needed for pain., Disp: , Rfl:    SYMBICORT 160-4.5 MCG/ACT inhaler, Inhale 2 puffs into the lungs 2 (two) times daily., Disp: , Rfl:   Current Facility-Administered Medications:    triamcinolone acetonide (KENALOG) 10 MG/ML injection 10 mg, 10 mg, Other, Once, Regal, Tamala Fothergill, DPM

## 2021-12-18 ENCOUNTER — Institutional Professional Consult (permissible substitution): Payer: Medicaid Other | Admitting: Pulmonary Disease

## 2022-01-22 ENCOUNTER — Institutional Professional Consult (permissible substitution): Payer: Medicaid Other | Admitting: Pulmonary Disease

## 2022-02-08 ENCOUNTER — Emergency Department (HOSPITAL_COMMUNITY)
Admission: EM | Admit: 2022-02-08 | Discharge: 2022-02-08 | Disposition: A | Discharge status: Home / Self Care | Payer: Medicaid Other | Attending: Emergency Medicine | Admitting: Emergency Medicine

## 2022-02-08 ENCOUNTER — Other Ambulatory Visit: Payer: Self-pay

## 2022-02-08 DIAGNOSIS — R197 Diarrhea, unspecified: Secondary | ICD-10-CM | POA: Insufficient documentation

## 2022-02-08 DIAGNOSIS — R112 Nausea with vomiting, unspecified: Secondary | ICD-10-CM | POA: Insufficient documentation

## 2022-02-08 LAB — COMPREHENSIVE METABOLIC PANEL
ALT: 11 U/L (ref 0–44)
AST: 13 U/L — ABNORMAL LOW (ref 15–41)
Albumin: 3.5 g/dL (ref 3.5–5.0)
Alkaline Phosphatase: 80 U/L (ref 38–126)
Anion gap: 9 (ref 5–15)
BUN: 8 mg/dL (ref 6–20)
CO2: 22 mmol/L (ref 22–32)
Calcium: 8.8 mg/dL — ABNORMAL LOW (ref 8.9–10.3)
Chloride: 104 mmol/L (ref 98–111)
Creatinine, Ser: 0.61 mg/dL (ref 0.44–1.00)
GFR, Estimated: >60 mL/min (ref >60)
Glucose, Bld: 108 mg/dL — ABNORMAL HIGH (ref 70–99)
Potassium: 3 mmol/L — ABNORMAL LOW (ref 3.5–5.1)
Sodium: 135 mmol/L (ref 135–145)
Total Bilirubin: 0.6 mg/dL (ref 0.3–1.2)
Total Protein: 7.7 g/dL (ref 6.5–8.1)

## 2022-02-08 LAB — CBC WITH DIFFERENTIAL/PLATELET
Abs Immature Granulocytes: 0.04 10*3/uL (ref 0.00–0.07)
Basophils Absolute: 0.1 10*3/uL (ref 0.0–0.1)
Basophils Relative: 1 %
Eosinophils Absolute: 0.1 10*3/uL (ref 0.0–0.5)
Eosinophils Relative: 1 %
HCT: 41.6 % (ref 36.0–46.0)
Hemoglobin: 13.3 g/dL (ref 12.0–15.0)
Immature Granulocytes: 0 %
Lymphocytes Relative: 16 %
Lymphs Abs: 1.7 10*3/uL (ref 0.7–4.0)
MCH: 25.1 pg — ABNORMAL LOW (ref 26.0–34.0)
MCHC: 32 g/dL (ref 30.0–36.0)
MCV: 78.6 fL — ABNORMAL LOW (ref 80.0–100.0)
Monocytes Absolute: 0.9 10*3/uL (ref 0.1–1.0)
Monocytes Relative: 9 %
Neutro Abs: 7.4 10*3/uL (ref 1.7–7.7)
Neutrophils Relative %: 73 %
Platelets: 286 10*3/uL (ref 150–400)
RBC: 5.29 MIL/uL — ABNORMAL HIGH (ref 3.87–5.11)
RDW: 15.5 % (ref 11.5–15.5)
WBC: 10.2 10*3/uL (ref 4.0–10.5)
nRBC: 0 % (ref 0.0–0.2)

## 2022-02-08 LAB — LIPASE, BLOOD: Lipase: 24 U/L (ref 11–51)

## 2022-02-08 MED ORDER — ONDANSETRON HCL 4 MG PO TABS: 4.0000 mg | ORAL_TABLET | Freq: Four times a day (QID) | ORAL | 0 refills | Status: AC

## 2022-02-08 NOTE — ED Triage Notes (Signed)
C/o generalized abd pain with n/v/d, fever, and cough x4 days.  Also c/o urinary frequency

## 2022-02-08 NOTE — Discharge Instructions (Signed)
Overall since fact that you have a viral gastroenteritis.  This should continue to improve on its own.  Take Zofran as needed for nausea and vomiting.  Recommend Tylenol for any other discomfort as well.  Continue to drink plenty of fluids.  Recommend a bland diet with crackers and toast and then gradually advancing her diet.

## 2022-02-08 NOTE — ED Provider Triage Note (Signed)
Emergency Medicine Provider Triage Evaluation Note  Monique Fowler , a 57 y.o. female  was evaluated in triage.  Pt complains of generalized abdominal pain x 4 days.  Patient reports that she is also having associated nausea, vomiting, diarrhea as well as fever and cough.  Patient denies any known sick contacts.  Reports increased urinary frequency over the last few days as well.  Review of Systems  Positive: As above Negative: As above  Physical Exam  BP 133/87   Pulse (!) 106   Temp 98 F (36.7 C)   Resp 20   Wt 81 kg   LMP 08/13/2012   SpO2 99%   BMI 27.97 kg/m  Gen:   Awake, no distress   Resp:  Normal effort  MSK:   Moves extremities without difficulty  Other:  Generalized abdominal tenderness  Medical Decision Making  Medically screening exam initiated at 1:47 PM.  Appropriate orders placed.  Dorinne Graeff was informed that the remainder of the evaluation will be completed by another provider, this initial triage assessment does not replace that evaluation, and the importance of remaining in the ED until their evaluation is complete.     Luvenia Heller, PA-C 02/08/22 1348

## 2022-02-08 NOTE — ED Provider Notes (Signed)
[facility] Provider Note   CSN: [id number] Arrival date & time: 02/08/22  1320     History  Chief Complaint  Patient presents with   Abdominal Pain    Monique Fowler is a 57 y.o. female.  Patient here with nausea vomiting diarrhea the last few days.  Denies any sick contacts.  May be suspicious food intake.  History of bipolar and depression.  She had C-section in the past.  But no other major abdominal surgeries.  She has cramping bloating pain throughout intermittently that gets better with bowel movement.  Nausea and vomiting has improved.  She is been taking Zofran at home.  She been able to eat and drink without much issues.  However when she does eat she does feel some cramping in her belly.  She denies any chest pain or shortness of breath or pain with urination.  No vaginal discharge.  The history is provided by the patient.       Home Medications Prior to Admission medications   Medication Sig Start Date End Date Taking? Authorizing Provider  ondansetron (ZOFRAN) 4 MG tablet Take 1 tablet (4 mg total) by mouth every 6 (six) hours. 02/08/22  Yes Onesti Bonfiglio, DO  albuterol (PROVENTIL HFA;VENTOLIN HFA) 108 (90 Base) MCG/ACT inhaler Inhale 1-2 puffs into the lungs every 6 (six) hours as needed for wheezing or shortness of breath.    [provider]  allopurinol (ZYLOPRIM) 300 MG tablet Take 300 mg by mouth daily. 07/18/20   [provider]  ammonium lactate (LAC-HYDRIN) 12 % lotion Apply 1 application topically daily. 08/08/20   [provider]  atorvastatin (LIPITOR) 20 MG tablet Take 20 mg by mouth daily. 05/19/20   [provider]  azithromycin (ZITHROMAX Z-PAK) 250 MG tablet 2 po day one, then 1 daily x 4 days Patient not taking: Reported on 05/25/2018 05/01/18   Monique Shanks, MD  benzonatate (TESSALON) 100 MG capsule Take 1 capsule (100 mg total) by mouth every 8 (eight) hours. Patient not  taking: Reported on 05/25/2018 05/01/18   Monique Shanks, MD  ciclopirox [facility]) 8 % solution Apply topically at bedtime. 07/12/20   [provider]  fluticasone (FLONASE) 50 MCG/ACT nasal spray Place 2 sprays into both nostrils daily. Patient not taking: Reported on 05/25/2018 06/24/17 07/24/17  Monique Bruce, MD  HYDROcodone-homatropine [facility]) 5-1.5 MG/5ML syrup 5-8m every 6 hours as needed for severe cough Patient not taking: Reported on 05/25/2018 05/01/18   PCharlesetta Shanks MD  lisinopril (ZESTRIL) 20 MG tablet Take 20 mg by mouth daily. 08/19/20   [provider]  lisinopril-hydrochlorothiazide (PRINZIDE,ZESTORETIC) 20-12.5 MG tablet Take 1 tablet by mouth daily. 06/21/17   [provider]  meclizine (ANTIVERT) 25 MG tablet Take 1 tablet (25 mg total) by mouth 3 (three) times daily as needed for dizziness. Patient not taking: Reported on 05/25/2018 06/24/17   IDuffy Bruce MD  omeprazole (PRILOSEC) 20 MG capsule Take 20 mg by mouth daily. 06/01/17   [provider]  oxyCODONE-acetaminophen (PERCOCET) 10-325 MG tablet Take 1 tablet by mouth every 4 (four) hours as needed for pain.    [provider]  SYMBICORT 160-4.5 MCG/ACT inhaler Inhale 2 puffs into the lungs 2 (two) times daily. 08/23/20   [provider]      Allergies    Tramadol, Acetaminophen, and Aspirin    Review of Systems   Review of Systems  Physical Exam Updated Vital Signs BP (!) 114/98  Pulse (!) 110   Temp 98 F (36.7 C)   Resp 20   Wt 81 kg   LMP 08/13/2012   SpO2 100%   BMI 27.97 kg/m  Physical Exam Vitals and nursing note reviewed.  Constitutional:      General: She is not in acute distress.    Appearance: She is well-developed. She is not ill-appearing.  HENT:     Head: Normocephalic and atraumatic.  Eyes:     Extraocular Movements: Extraocular movements intact.     Conjunctiva/sclera: Conjunctivae normal.     Pupils: Pupils are equal, round, and  reactive to light.  Cardiovascular:     Rate and Rhythm: Normal rate and regular rhythm.     Heart sounds: Normal heart sounds. No murmur heard. Pulmonary:     Effort: Pulmonary effort is normal. No respiratory distress.     Breath sounds: Normal breath sounds.  Abdominal:     General: Abdomen is flat. Bowel sounds are normal.     Palpations: Abdomen is soft.     Tenderness: There is no abdominal tenderness. There is no guarding or rebound.  Musculoskeletal:        General: No swelling.     Cervical back: Neck supple.  Skin:    General: Skin is warm and dry.     Capillary Refill: Capillary refill takes less than 2 seconds.  Neurological:     Mental Status: She is alert.  Psychiatric:        Mood and Affect: Mood normal.     ED Results / Procedures / Treatments   Labs (all labs ordered are listed, but only abnormal results are displayed) Labs Reviewed  COMPREHENSIVE METABOLIC PANEL - Abnormal; Notable for the following components:      Result Value   Potassium 3.0 (*)    Glucose, Bld 108 (*)    Calcium 8.8 (*)    AST 13 (*)    All other components within normal limits  CBC WITH DIFFERENTIAL/PLATELET - Abnormal; Notable for the following components:   RBC 5.29 (*)    MCV 78.6 (*)    MCH 25.1 (*)    All other components within normal limits  LIPASE, BLOOD    EKG None  Radiology No results found.  Procedures Procedures    Medications Ordered in ED Medications - No data to display  ED Course/ Medical Decision Making/ A&P                             Medical Decision Making  [name] is here with nausea vomiting diarrhea.  History of bipolar and depression.  Has had C-section in the past but no other major abdominal surgeries.  She has been having nausea vomiting diarrhea on and off for the last 3 to 4 days.  May be suspicious food intake.  Differential diagnosis likely viral gastroenteritis, dehydration.  I have no concern for intra-abdominal process  such as appendicitis or diverticulitis.  She is not focally tender.  She denies any pain with urination.  She does not want to have her urine checked.  She is already had a CBC, CMP and lipase prior to my evaluation.  Per my review and interpretation of labs no significant anemia or electrolyte abnormality or kidney injury.  She looks very well.  Abdominal exam is benign.  Will prescribe Zofran.  Recommend hydration at home and bland diet.  Understands return precautions.  Discharged in  good condition.  This chart was dictated using voice recognition software.  Despite best efforts to proofread,  errors can occur which can change the documentation meaning.         Final Clinical Impression(s) / ED Diagnoses Final diagnoses:  Nausea vomiting and diarrhea    Rx / DC Orders ED Discharge Orders          Ordered    ondansetron (ZOFRAN) 4 MG tablet  Every 6 hours        02/08/22 [place], [place] Borchardt, DO 02/08/22 1618

## 2022-03-12 ENCOUNTER — Other Ambulatory Visit: Payer: Self-pay | Admitting: Physician Assistant

## 2022-03-12 DIAGNOSIS — J0100 Acute maxillary sinusitis, unspecified: Secondary | ICD-10-CM | POA: Diagnosis not present

## 2022-03-12 DIAGNOSIS — Z1231 Encounter for screening mammogram for malignant neoplasm of breast: Secondary | ICD-10-CM

## 2022-03-12 DIAGNOSIS — F411 Generalized anxiety disorder: Secondary | ICD-10-CM | POA: Diagnosis not present

## 2022-03-12 DIAGNOSIS — I10 Essential (primary) hypertension: Secondary | ICD-10-CM | POA: Diagnosis not present

## 2022-03-12 DIAGNOSIS — J418 Mixed simple and mucopurulent chronic bronchitis: Secondary | ICD-10-CM | POA: Diagnosis not present

## 2022-05-11 ENCOUNTER — Ambulatory Visit
Admission: RE | Admit: 2022-05-11 | Discharge: 2022-05-11 | Disposition: A | Discharge status: Home / Self Care | Payer: Medicaid Other | Source: Ambulatory Visit | Attending: Physician Assistant | Admitting: Physician Assistant

## 2022-05-11 DIAGNOSIS — Z1231 Encounter for screening mammogram for malignant neoplasm of breast: Secondary | ICD-10-CM

## 2022-12-19 ENCOUNTER — Other Ambulatory Visit: Payer: Self-pay

## 2022-12-19 ENCOUNTER — Emergency Department (HOSPITAL_COMMUNITY)
Admission: EM | Admit: 2022-12-19 | Discharge: 2022-12-19 | Disposition: A | Discharge status: Home / Self Care | Payer: MEDICAID | Attending: Emergency Medicine | Admitting: Emergency Medicine

## 2022-12-19 ENCOUNTER — Encounter (HOSPITAL_COMMUNITY): Payer: Self-pay

## 2022-12-19 ENCOUNTER — Emergency Department (HOSPITAL_COMMUNITY): Payer: MEDICAID

## 2022-12-19 DIAGNOSIS — J449 Chronic obstructive pulmonary disease, unspecified: Secondary | ICD-10-CM | POA: Insufficient documentation

## 2022-12-19 DIAGNOSIS — J441 Chronic obstructive pulmonary disease with (acute) exacerbation: Secondary | ICD-10-CM

## 2022-12-19 DIAGNOSIS — R0602 Shortness of breath: Secondary | ICD-10-CM | POA: Diagnosis present

## 2022-12-19 DIAGNOSIS — Z7951 Long term (current) use of inhaled steroids: Secondary | ICD-10-CM | POA: Insufficient documentation

## 2022-12-19 DIAGNOSIS — F17200 Nicotine dependence, unspecified, uncomplicated: Secondary | ICD-10-CM | POA: Diagnosis not present

## 2022-12-19 LAB — CBC
HCT: 48.2 % — ABNORMAL HIGH (ref 36.0–46.0)
Hemoglobin: 15.2 g/dL — ABNORMAL HIGH (ref 12.0–15.0)
MCH: 24.9 pg — ABNORMAL LOW (ref 26.0–34.0)
MCHC: 31.5 g/dL (ref 30.0–36.0)
MCV: 78.9 fL — ABNORMAL LOW (ref 80.0–100.0)
Platelets: 305 10*3/uL (ref 150–400)
RBC: 6.11 MIL/uL — ABNORMAL HIGH (ref 3.87–5.11)
RDW: 18.1 % — ABNORMAL HIGH (ref 11.5–15.5)
WBC: 15.4 10*3/uL — ABNORMAL HIGH (ref 4.0–10.5)
nRBC: 0 % (ref 0.0–0.2)

## 2022-12-19 LAB — BASIC METABOLIC PANEL
Anion gap: 12 (ref 5–15)
BUN: 12 mg/dL (ref 6–20)
CO2: 26 mmol/L (ref 22–32)
Calcium: 9.9 mg/dL (ref 8.9–10.3)
Chloride: 98 mmol/L (ref 98–111)
Creatinine, Ser: 0.58 mg/dL (ref 0.44–1.00)
GFR, Estimated: >60 mL/min (ref >60)
Glucose, Bld: 127 mg/dL — ABNORMAL HIGH (ref 70–99)
Potassium: 4.5 mmol/L (ref 3.5–5.1)
Sodium: 136 mmol/L (ref 135–145)

## 2022-12-19 LAB — D-DIMER, QUANTITATIVE: D-Dimer, Quant: <0.27 ug{FEU}/mL (ref 0.00–0.50)

## 2022-12-19 MED ORDER — PREDNISONE 50 MG PO TABS: ORAL_TABLET | ORAL | 0 refills | Status: DC

## 2022-12-19 MED ORDER — PREDNISONE 20 MG PO TABS
60.0000 mg | ORAL_TABLET | Freq: Once | ORAL | Status: AC
  Administered 2022-12-19: 60 mg via ORAL
  Filled 2022-12-19: qty 3

## 2022-12-19 MED ORDER — ALBUTEROL SULFATE (2.5 MG/3ML) 0.083% IN NEBU
10.0000 mg/h | INHALATION_SOLUTION | Freq: Once | RESPIRATORY_TRACT | Status: AC
Start: 2022-12-19 — End: 2022-12-19
  Administered 2022-12-19: 10 mg/h via RESPIRATORY_TRACT
  Filled 2022-12-19: qty 3

## 2022-12-19 MED ORDER — PREDNISONE 50 MG PO TABS: ORAL_TABLET | ORAL | 0 refills | Status: AC

## 2022-12-19 MED ORDER — IPRATROPIUM BROMIDE 0.02 % IN SOLN
0.5000 mg | Freq: Once | RESPIRATORY_TRACT | Status: AC
  Administered 2022-12-19: 0.5 mg via RESPIRATORY_TRACT
  Filled 2022-12-19: qty 2.5

## 2022-12-19 NOTE — ED Provider Notes (Signed)
[place] EMERGENCY DEPARTMENT AT [facility] Provider Note   CSN: [id number] Arrival date & time: 12/19/22  1504     History  Chief Complaint  Patient presents with   Shortness of Breath    Monique Fowler is a 57 y.o. female.  57 year old female with history of alcohol abuse presents with increased shortness of breath.  Patient diagnosed with pneumonia 2 weeks ago and continues to be short of breath.  Denies any leg pain or swelling.  Prior history of PE.  No hemoptysis.  Patient continues to smoke tobacco products.  Using her home inhaler without relief.       Home Medications Prior to Admission medications   Medication Sig Start Date End Date Taking? Authorizing Provider  albuterol (PROVENTIL HFA;VENTOLIN HFA) 108 (90 Base) MCG/ACT inhaler Inhale 1-2 puffs into the lungs every 6 (six) hours as needed for wheezing or shortness of breath.    [provider]  allopurinol (ZYLOPRIM) 300 MG tablet Take 300 mg by mouth daily. 07/18/20   [provider]  ammonium lactate (LAC-HYDRIN) 12 % lotion Apply 1 application topically daily. 08/08/20   [provider]  atorvastatin (LIPITOR) 20 MG tablet Take 20 mg by mouth daily. 05/19/20   [provider]  azithromycin (ZITHROMAX Z-PAK) 250 MG tablet 2 po day one, then 1 daily x 4 days Patient not taking: Reported on 05/25/2018 05/01/18   Arby Barrette, MD  benzonatate (TESSALON) 100 MG capsule Take 1 capsule (100 mg total) by mouth every 8 (eight) hours. Patient not taking: Reported on 05/25/2018 05/01/18   Arby Barrette, MD  ciclopirox [facility]) 8 % solution Apply topically at bedtime. 07/12/20   [provider]  fluticasone (FLONASE) 50 MCG/ACT nasal spray Place 2 sprays into both nostrils daily. Patient not taking: Reported on 05/25/2018 06/24/17 07/24/17  Shaune Pollack, MD  HYDROcodone-homatropine [facility]) 5-1.5 MG/5ML syrup 5-79mL every 6 hours as needed for severe cough Patient not  taking: Reported on 05/25/2018 05/01/18   Arby Barrette, MD  lisinopril (ZESTRIL) 20 MG tablet Take 20 mg by mouth daily. 08/19/20   [provider]  lisinopril-hydrochlorothiazide (PRINZIDE,ZESTORETIC) 20-12.5 MG tablet Take 1 tablet by mouth daily. 06/21/17   [provider]  meclizine (ANTIVERT) 25 MG tablet Take 1 tablet (25 mg total) by mouth 3 (three) times daily as needed for dizziness. Patient not taking: Reported on 05/25/2018 06/24/17   Shaune Pollack, MD  omeprazole (PRILOSEC) 20 MG capsule Take 20 mg by mouth daily. 06/01/17   [provider]  ondansetron (ZOFRAN) 4 MG tablet Take 1 tablet (4 mg total) by mouth every 6 (six) hours. 02/08/22   Cur[place]olo, Adam, DO  oxyCODONE-acetaminophen (PERCOCET) 10-325 MG tablet Take 1 tablet by mouth every 4 (four) hours as needed for pain.    [provider]  SYMBICORT 160-4.5 MCG/ACT inhaler Inhale 2 puffs into the lungs 2 (two) times daily. 08/23/20   [provider]      Allergies    Tramadol, Acetaminophen, and Aspirin    Review of Systems   Review of Systems  All other systems reviewed and are negative.   Physical Exam Updated Vital Signs BP (!) 148/99   Pulse (!) 104   Temp 98.1 F (36.7 C) (Oral)   Resp 20   Ht 1.702 m (5\' 7" )   Wt 94.8 kg   LMP 08/13/2012   SpO2 96%   BMI 32.73 kg/m  Physical Exam Vitals and nursing note reviewed.  Constitutional:  General: She is not in acute distress.    Appearance: Normal appearance. She is well-developed. She is not toxic-appearing.  HENT:     Head: Normocephalic and atraumatic.  Eyes:     General: Lids are normal.     Conjunctiva/sclera: Conjunctivae normal.     Pupils: Pupils are equal, round, and reactive to light.  Neck:     Thyroid: No thyroid mass.     Trachea: No tracheal deviation.  Cardiovascular:     Rate and Rhythm: Regular rhythm. Tachycardia present.     Heart sounds: Normal heart sounds. No murmur heard.    No gallop.   Pulmonary:     Effort: Pulmonary effort is normal. No tachypnea or respiratory distress.     Breath sounds: Decreased air movement present. No stridor. Decreased breath sounds and wheezing present. No rhonchi or rales.  Abdominal:     General: There is no distension.     Palpations: Abdomen is soft.     Tenderness: There is no abdominal tenderness. There is no rebound.  Musculoskeletal:        General: No tenderness. Normal range of motion.     Cervical back: Normal range of motion and neck supple.  Skin:    General: Skin is warm and dry.     Findings: No abrasion or rash.  Neurological:     Mental Status: She is alert and oriented to person, place, and time. Mental status is at baseline.     GCS: GCS eye subscore is 4. GCS verbal subscore is 5. GCS motor subscore is 6.     Cranial Nerves: Cranial nerves are intact. No cranial nerve deficit.     Sensory: No sensory deficit.     Motor: Motor function is intact.  Psychiatric:        Attention and Perception: Attention normal.        Speech: Speech normal.        Behavior: Behavior normal.     ED Results / Procedures / Treatments   Labs (all labs ordered are listed, but only abnormal results are displayed) Labs Reviewed  CBC - Abnormal; Notable for the following components:      Result Value   WBC 15.4 (*)    RBC 6.11 (*)    Hemoglobin 15.2 (*)    HCT 48.2 (*)    MCV 78.9 (*)    MCH 24.9 (*)    RDW 18.1 (*)    All other components within normal limits  BASIC METABOLIC PANEL    EKG EKG Interpretation Date/Time:  Saturday December 19 2022 15:10:31 EST Ventricular Rate:  102 PR Interval:  130 QRS Duration:  73 QT Interval:  323 QTC Calculation: 421 R Axis:   83  Text Interpretation: Sinus tachycardia Anteroseptal infarct, old Confirmed by Lorre Nick ([id number]) on 12/19/2022 3:31:13 PM  Radiology DG Chest 2 View  Result Date: 12/19/2022 CLINICAL DATA:  SOB chronic cough EXAM: CHEST - 2 VIEW COMPARISON:   05/25/2018. FINDINGS: The heart size and mediastinal contours are within normal limits. Lungs are hyperinflated suggesting COPD. There is no focal consolidation. No pneumothorax or pleural effusion. The visualized skeletal structures are unremarkable. IMPRESSION: Findings suggest COPD.  Otherwise no acute cardiopulmonary disease. Electronically Signed   By: Layla Maw M.D.   On: 12/19/2022 15:46    Procedures Procedures    Medications Ordered in ED Medications  albuterol (PROVENTIL,VENTOLIN) solution continuous neb (has no administration in time range)  ipratropium (ATROVENT) nebulizer solution 0.5  mg (has no administration in time range)  predniSONE (DELTASONE) tablet 60 mg (has no administration in time range)    ED Course/ Medical Decision Making/ A&P                                 Medical Decision Making Amount and/or Complexity of Data Reviewed Labs: ordered. Radiology: ordered.  Risk Prescription drug management.   Patient is EKG shows sinus tachycardia.  Concern for possible PE and D-dimer negative.  Chest x-ray only shows COPD.  Patient given prednisone along with albuterol 10 mg continuous treatment along with Atrovent.  Patient reassessed.  Lung exam is much more improved.  She is breathing easier.  Plan will be to place patient on short course of prednisone.  Will follow-up with her doctor  CRITICAL CARE Performed by: Toy Baker Total critical care time: 45 minutes Critical care time was exclusive of separately billable procedures and treating other patients. Critical care was necessary to treat or prevent imminent or life-threatening deterioration. Critical care was time spent personally by me on the following activities: development of treatment plan with patient and/or surrogate as well as nursing, discussions with consultants, evaluation of patient's response to treatment, examination of patient, obtaining history from patient or surrogate, ordering and  performing treatments and interventions, ordering and review of laboratory studies, ordering and review of radiographic studies, pulse oximetry and re-evaluation of patient's condition.         Final Clinical Impression(s) / ED Diagnoses Final diagnoses:  None    Rx / DC Orders ED Discharge Orders     None         Lorre Nick, MD 12/19/22 1658

## 2022-12-19 NOTE — Discharge Instructions (Signed)
Take 2 puffs with your butyryl inhaler every 4 hours for the next 2 days.  Follow-up with your doctor next week.  Return here for any trouble breathing

## 2022-12-19 NOTE — ED Triage Notes (Signed)
Patient reports she was dx with pneumonia 2 weeks ago. States she is not getting better and is feeling more SOB. Was given steroids and inhalers without relief. 94% on RA

## 2023-02-23 NOTE — Progress Notes (Shared)
[facility] - Clinic Note  03/03/2023   CHIEF COMPLAINT Patient presents for No chief complaint on file.  HISTORY OF PRESENT ILLNESS: Monique Fowler is a 58 y.o. female who presents to the clinic today for:   Referring physician: Care, Jovita Kussmaul [facility] [address] [place],  [place] [address]  HISTORICAL INFORMATION:  Selected notes from the MEDICAL RECORD NUMBER Referred by Norva Riffle, PA for DM exam LEE:  Ocular Hx- PMH-   CURRENT MEDICATIONS: No current outpatient medications on file. (Ophthalmic Drugs)   No current facility-administered medications for this visit. (Ophthalmic Drugs)   Current Outpatient Medications (Other)  Medication Sig   albuterol (PROVENTIL HFA;VENTOLIN HFA) 108 (90 Base) MCG/ACT inhaler Inhale 1-2 puffs into the lungs every 6 (six) hours as needed for wheezing or shortness of breath.   allopurinol (ZYLOPRIM) 300 MG tablet Take 300 mg by mouth daily.   ammonium lactate (LAC-HYDRIN) 12 % lotion Apply 1 application topically daily.   atorvastatin (LIPITOR) 20 MG tablet Take 20 mg by mouth daily.   azithromycin (ZITHROMAX Z-PAK) 250 MG tablet 2 po day one, then 1 daily x 4 days (Patient not taking: Reported on 05/25/2018)   benzonatate (TESSALON) 100 MG capsule Take 1 capsule (100 mg total) by mouth every 8 (eight) hours. (Patient not taking: Reported on 05/25/2018)   ciclopirox (PENLAC) 8 % solution Apply topically at bedtime.   fluticasone (FLONASE) 50 MCG/ACT nasal spray Place 2 sprays into both nostrils daily. (Patient not taking: Reported on 05/25/2018)   HYDROcodone-homatropine (HYCODAN) 5-1.5 MG/5ML syrup 5-15mL every 6 hours as needed for severe cough (Patient not taking: Reported on 05/25/2018)   lisinopril (ZESTRIL) 20 MG tablet Take 20 mg by mouth daily.   lisinopril-hydrochlorothiazide (PRINZIDE,ZESTORETIC) 20-12.5 MG tablet Take 1 tablet by mouth daily.   meclizine (ANTIVERT) 25 MG tablet Take 1  tablet (25 mg total) by mouth 3 (three) times daily as needed for dizziness. (Patient not taking: Reported on 05/25/2018)   omeprazole (PRILOSEC) 20 MG capsule Take 20 mg by mouth daily.   ondansetron (ZOFRAN) 4 MG tablet Take 1 tablet (4 mg total) by mouth every 6 (six) hours.   oxyCODONE-acetaminophen (PERCOCET) 10-325 MG tablet Take 1 tablet by mouth every 4 (four) hours as needed for pain.   predniSONE (DELTASONE) 50 MG tablet 1 p.o. daily x 5   SYMBICORT 160-4.5 MCG/ACT inhaler Inhale 2 puffs into the lungs 2 (two) times daily.   Current Facility-Administered Medications (Other)  Medication Route   triamcinolone acetonide (KENALOG) 10 MG/ML injection 10 mg Other   REVIEW OF SYSTEMS:  ALLERGIES Allergies  Allergen Reactions   Tramadol Nausea And Vomiting and Other (See Comments)   Acetaminophen Nausea Only and Other (See Comments)   Aspirin Nausea Only   PAST MEDICAL HISTORY Past Medical History:  Diagnosis Date   Arthritis    right knee   Asthma    prn inhaler   Bipolar affective (HCC)    Breast mass, left 04/2016   Depression    GERD (gastroesophageal reflux disease)    no current med.   Hypertension    states BP "sometimes" under control with med., has been on med. x 3 yr.   Wears partial dentures    upper   Past Surgical History:  Procedure Laterality Date   BREAST BIOPSY Left 01/16/2016   BREAST EXCISIONAL BIOPSY Left    CESAREAN SECTION     KNEE ARTHROSCOPY Right  KNEE SURGERY     FAMILY HISTORY Family History  Problem Relation Age of Onset   Breast cancer Cousin    SOCIAL HISTORY Social History   Tobacco Use   Smoking status: Every Day    Current packs/day: 0.00    Types: Cigarettes   Smokeless tobacco: Never   Tobacco comments:    5 cig./day  Substance Use Topics   Alcohol use: No   Drug use: No       OPHTHALMIC EXAM:  Not recorded    IMAGING AND PROCEDURES  Imaging and Procedures for 03/03/2023        ASSESSMENT/PLAN:    ICD-10-CM   1. Retinal edema  H35.81      1.  2.  3.  Ophthalmic Meds Ordered this visit:  No orders of the defined types were placed in this encounter.    No follow-ups on file.  There are no Patient Instructions on file for this visit.  Explained the diagnoses, plan, and follow up with the patient and they expressed understanding.  Patient expressed understanding of the importance of proper follow up care.   This document serves as a record of services personally performed by Karie Chimera, MD, PhD. It was created on their behalf by Glee Arvin. Manson Passey, OA an ophthalmic technician. The creation of this record is the provider's dictation and/or activities during the visit.    Electronically signed by: Glee Arvin. Manson Passey, OA 02/23/23 11:42 AM   Karie Chimera, M.D., Ph.D. Diseases & Surgery of the Retina and Vitreous [facility] 03/03/2023  Abbreviations: M myopia (nearsighted); A astigmatism; H hyperopia (farsighted); P presbyopia; Mrx spectacle prescription;  CTL contact lenses; OD right eye; OS left eye; OU both eyes  XT exotropia; ET esotropia; PEK punctate epithelial keratitis; PEE punctate epithelial erosions; DES dry eye syndrome; MGD meibomian gland dysfunction; ATs artificial tears; PFAT's preservative free artificial tears; NSC nuclear sclerotic cataract; PSC posterior subcapsular cataract; ERM epi-retinal membrane; PVD posterior vitreous detachment; RD retinal detachment; DM diabetes mellitus; DR diabetic retinopathy; NPDR non-proliferative diabetic retinopathy; PDR proliferative diabetic retinopathy; CSME clinically significant macular edema; DME diabetic macular edema; dbh dot blot hemorrhages; CWS cotton wool spot; POAG primary open angle glaucoma; C/D cup-to-disc ratio; HVF humphrey visual field; GVF goldmann visual field; OCT optical coherence tomography; IOP intraocular pressure; BRVO Branch retinal vein occlusion; CRVO central retinal vein occlusion;  CRAO central retinal artery occlusion; BRAO branch retinal artery occlusion; RT retinal tear; SB scleral buckle; PPV pars plana vitrectomy; VH Vitreous hemorrhage; PRP panretinal laser photocoagulation; IVK intravitreal kenalog; VMT vitreomacular traction; MH Macular hole;  NVD neovascularization of the disc; NVE neovascularization elsewhere; AREDS age related eye disease study; ARMD age related macular degeneration; POAG primary open angle glaucoma; EBMD epithelial/anterior basement membrane dystrophy; ACIOL anterior chamber intraocular lens; IOL intraocular lens; PCIOL posterior chamber intraocular lens; Phaco/IOL phacoemulsification with intraocular lens placement; PRK photorefractive keratectomy; LASIK laser assisted in situ keratomileusis; HTN hypertension; DM diabetes mellitus; COPD chronic obstructive pulmonary disease

## 2023-03-03 ENCOUNTER — Encounter (INDEPENDENT_AMBULATORY_CARE_PROVIDER_SITE_OTHER): Payer: MEDICAID | Admitting: Ophthalmology

## 2023-03-03 DIAGNOSIS — H3581 Retinal edema: Secondary | ICD-10-CM

## 2023-03-09 ENCOUNTER — Ambulatory Visit (HOSPITAL_COMMUNITY): Payer: MEDICAID | Admitting: Student

## 2023-03-15 NOTE — Progress Notes (Signed)
 **PT LEFT BEFORE BEING SEEN BY MD DUE TO FAMILY EMERGENCY**     [facility] - Clinic Note  03/22/2023   CHIEF COMPLAINT Patient presents for No chief complaint on file.  HISTORY OF PRESENT ILLNESS: Monique Fowler is a 58 y.o. female who presents to the clinic today for:   Referring physician: Care, Jovita Kussmaul [facility] [address] [place],  [place] [address]  HISTORICAL INFORMATION:  Selected notes from the MEDICAL RECORD NUMBER Referred by Norva Riffle, PA for DM exam LEE:  Ocular Hx- PMH-   CURRENT MEDICATIONS: No current outpatient medications on file. (Ophthalmic Drugs)   No current facility-administered medications for this visit. (Ophthalmic Drugs)   Current Outpatient Medications (Other)  Medication Sig   albuterol (PROVENTIL HFA;VENTOLIN HFA) 108 (90 Base) MCG/ACT inhaler Inhale 1-2 puffs into the lungs every 6 (six) hours as needed for wheezing or shortness of breath.   allopurinol (ZYLOPRIM) 300 MG tablet Take 300 mg by mouth daily.   ammonium lactate (LAC-HYDRIN) 12 % lotion Apply 1 application topically daily.   atorvastatin (LIPITOR) 20 MG tablet Take 20 mg by mouth daily.   azithromycin (ZITHROMAX Z-PAK) 250 MG tablet 2 po day one, then 1 daily x 4 days (Patient not taking: Reported on 05/25/2018)   benzonatate (TESSALON) 100 MG capsule Take 1 capsule (100 mg total) by mouth every 8 (eight) hours. (Patient not taking: Reported on 05/25/2018)   ciclopirox (PENLAC) 8 % solution Apply topically at bedtime.   fluticasone (FLONASE) 50 MCG/ACT nasal spray Place 2 sprays into both nostrils daily. (Patient not taking: Reported on 05/25/2018)   HYDROcodone-homatropine (HYCODAN) 5-1.5 MG/5ML syrup 5-33mL every 6 hours as needed for severe cough (Patient not taking: Reported on 05/25/2018)   lisinopril (ZESTRIL) 20 MG tablet Take 20 mg by mouth daily.   lisinopril-hydrochlorothiazide (PRINZIDE,ZESTORETIC) 20-12.5 MG tablet Take 1  tablet by mouth daily.   meclizine (ANTIVERT) 25 MG tablet Take 1 tablet (25 mg total) by mouth 3 (three) times daily as needed for dizziness. (Patient not taking: Reported on 05/25/2018)   omeprazole (PRILOSEC) 20 MG capsule Take 20 mg by mouth daily.   ondansetron (ZOFRAN) 4 MG tablet Take 1 tablet (4 mg total) by mouth every 6 (six) hours.   oxyCODONE-acetaminophen (PERCOCET) 10-325 MG tablet Take 1 tablet by mouth every 4 (four) hours as needed for pain.   predniSONE (DELTASONE) 50 MG tablet 1 p.o. daily x 5   SYMBICORT 160-4.5 MCG/ACT inhaler Inhale 2 puffs into the lungs 2 (two) times daily.   Current Facility-Administered Medications (Other)  Medication Route   triamcinolone acetonide (KENALOG) 10 MG/ML injection 10 mg Other   REVIEW OF SYSTEMS:  ALLERGIES Allergies  Allergen Reactions   Tramadol Nausea And Vomiting and Other (See Comments)   Acetaminophen Nausea Only and Other (See Comments)   Aspirin Nausea Only   PAST MEDICAL HISTORY Past Medical History:  Diagnosis Date   Arthritis    right knee   Asthma    prn inhaler   Bipolar affective (HCC)    Breast mass, left 04/2016   Depression    GERD (gastroesophageal reflux disease)    no current med.   Hypertension    states BP "sometimes" under control with med., has been on med. x 3 yr.   Wears partial dentures    upper   Past Surgical History:  Procedure Laterality Date   BREAST BIOPSY Left 01/16/2016   BREAST EXCISIONAL  BIOPSY Left    CESAREAN SECTION     KNEE ARTHROSCOPY Right    KNEE SURGERY     FAMILY HISTORY Family History  Problem Relation Age of Onset   Breast cancer Cousin    SOCIAL HISTORY Social History   Tobacco Use   Smoking status: Every Day    Current packs/day: 0.00    Types: Cigarettes   Smokeless tobacco: Never   Tobacco comments:    5 cig./day  Substance Use Topics   Alcohol use: No   Drug use: No       OPHTHALMIC EXAM:  Not recorded    IMAGING AND PROCEDURES  Imaging  and Procedures for 03/22/2023        ASSESSMENT/PLAN:   ICD-10-CM   1. Retinal edema  H35.81      Patient left before being seen due to having to pick up her child from day care.    2.  3.  Ophthalmic Meds Ordered this visit:  No orders of the defined types were placed in this encounter.    No follow-ups on file.  There are no Patient Instructions on file for this visit.  Explained the diagnoses, plan, and follow up with the patient and they expressed understanding.  Patient expressed understanding of the importance of proper follow up care.   This document serves as a record of services personally performed by Karie Chimera, MD, PhD. It was created on their behalf by Glee Arvin. Manson Passey, OA an ophthalmic technician. The creation of this record is the provider's dictation and/or activities during the visit.    Electronically signed by: Glee Arvin. Manson Passey, OA 03/15/23 10:00 AM    Karie Chimera, M.D., Ph.D. Diseases & Surgery of the Retina and Vitreous [facility] 03/22/2023  Abbreviations: M myopia (nearsighted); A astigmatism; H hyperopia (farsighted); P presbyopia; Mrx spectacle prescription;  CTL contact lenses; OD right eye; OS left eye; OU both eyes  XT exotropia; ET esotropia; PEK punctate epithelial keratitis; PEE punctate epithelial erosions; DES dry eye syndrome; MGD meibomian gland dysfunction; ATs artificial tears; PFAT's preservative free artificial tears; NSC nuclear sclerotic cataract; PSC posterior subcapsular cataract; ERM epi-retinal membrane; PVD posterior vitreous detachment; RD retinal detachment; DM diabetes mellitus; DR diabetic retinopathy; NPDR non-proliferative diabetic retinopathy; PDR proliferative diabetic retinopathy; CSME clinically significant macular edema; DME diabetic macular edema; dbh dot blot hemorrhages; CWS cotton wool spot; POAG primary open angle glaucoma; C/D cup-to-disc ratio; HVF humphrey visual field; GVF goldmann  visual field; OCT optical coherence tomography; IOP intraocular pressure; BRVO Branch retinal vein occlusion; CRVO central retinal vein occlusion; CRAO central retinal artery occlusion; BRAO branch retinal artery occlusion; RT retinal tear; SB scleral buckle; PPV pars plana vitrectomy; VH Vitreous hemorrhage; PRP panretinal laser photocoagulation; IVK intravitreal kenalog; VMT vitreomacular traction; MH Macular hole;  NVD neovascularization of the disc; NVE neovascularization elsewhere; AREDS age related eye disease study; ARMD age related macular degeneration; POAG primary open angle glaucoma; EBMD epithelial/anterior basement membrane dystrophy; ACIOL anterior chamber intraocular lens; IOL intraocular lens; PCIOL posterior chamber intraocular lens; Phaco/IOL phacoemulsification with intraocular lens placement; PRK photorefractive keratectomy; LASIK laser assisted in situ keratomileusis; HTN hypertension; DM diabetes mellitus; COPD chronic obstructive pulmonary disease

## 2023-03-19 ENCOUNTER — Other Ambulatory Visit: Payer: Self-pay | Admitting: Physician Assistant

## 2023-03-19 DIAGNOSIS — N644 Mastodynia: Secondary | ICD-10-CM

## 2023-03-22 ENCOUNTER — Encounter (INDEPENDENT_AMBULATORY_CARE_PROVIDER_SITE_OTHER): Payer: Self-pay

## 2023-03-22 ENCOUNTER — Encounter (INDEPENDENT_AMBULATORY_CARE_PROVIDER_SITE_OTHER): Payer: Self-pay | Admitting: Ophthalmology

## 2023-03-22 ENCOUNTER — Encounter (INDEPENDENT_AMBULATORY_CARE_PROVIDER_SITE_OTHER): Payer: MEDICAID | Admitting: Ophthalmology

## 2023-03-22 DIAGNOSIS — H3581 Retinal edema: Secondary | ICD-10-CM

## 2023-03-22 NOTE — Progress Notes (Signed)
 This encounter was created in error - please disregard.

## 2023-03-30 ENCOUNTER — Ambulatory Visit
Admission: RE | Admit: 2023-03-30 | Discharge: 2023-03-30 | Disposition: A | Discharge status: Home / Self Care | Payer: MEDICAID | Source: Ambulatory Visit | Attending: Physician Assistant | Admitting: Physician Assistant

## 2023-03-30 DIAGNOSIS — N644 Mastodynia: Secondary | ICD-10-CM

## 2023-03-31 ENCOUNTER — Ambulatory Visit: Payer: MEDICAID | Admitting: Skilled Nursing Facility1

## 2023-03-31 ENCOUNTER — Encounter (INDEPENDENT_AMBULATORY_CARE_PROVIDER_SITE_OTHER): Payer: MEDICAID | Admitting: Ophthalmology

## 2023-03-31 ENCOUNTER — Encounter (INDEPENDENT_AMBULATORY_CARE_PROVIDER_SITE_OTHER): Payer: Self-pay

## 2023-04-14 ENCOUNTER — Other Ambulatory Visit: Payer: Self-pay | Admitting: Physician Assistant

## 2023-04-14 DIAGNOSIS — Z1231 Encounter for screening mammogram for malignant neoplasm of breast: Secondary | ICD-10-CM

## 2023-05-04 ENCOUNTER — Other Ambulatory Visit: Payer: Self-pay | Admitting: Physician Assistant

## 2023-05-04 DIAGNOSIS — Z1231 Encounter for screening mammogram for malignant neoplasm of breast: Secondary | ICD-10-CM

## 2023-05-12 ENCOUNTER — Ambulatory Visit
Admission: RE | Admit: 2023-05-12 | Discharge: 2023-05-12 | Disposition: A | Discharge status: Home / Self Care | Payer: MEDICAID | Source: Ambulatory Visit | Attending: Physician Assistant | Admitting: Physician Assistant

## 2023-05-12 DIAGNOSIS — Z1231 Encounter for screening mammogram for malignant neoplasm of breast: Secondary | ICD-10-CM

## 2023-05-17 ENCOUNTER — Ambulatory Visit: Payer: MEDICAID | Admitting: Podiatry

## 2023-05-26 ENCOUNTER — Ambulatory Visit (INDEPENDENT_AMBULATORY_CARE_PROVIDER_SITE_OTHER): Payer: MEDICAID | Admitting: Podiatry

## 2023-05-26 ENCOUNTER — Encounter: Payer: Self-pay | Admitting: Podiatry

## 2023-05-26 DIAGNOSIS — M79671 Pain in right foot: Secondary | ICD-10-CM

## 2023-05-26 DIAGNOSIS — L600 Ingrowing nail: Secondary | ICD-10-CM

## 2023-05-26 DIAGNOSIS — B351 Tinea unguium: Secondary | ICD-10-CM | POA: Diagnosis not present

## 2023-05-26 NOTE — Progress Notes (Signed)
   Subjective:    HPI Presents for follow-up onychomycosis treatment with p.o. Lamisil.  No problems taking medicine with no side effects noted.  Wearing shoes at toes bilaterally nails.  complaint of tender nail spicule on lateral border hallux right.   Objective:  Physical Exam   General: AAO x3, NAD  Vascular: DP and PT pulses palpable bilaterally.  Immedate capillary fill time digits. No significant lower extremity edema bilaterally.  Dermatological: Ingrown lateral nail spicule hallux right with tenderness and some redness along the nail fold.  Onychomycotic mycotic changes nails 1 through 5 with discoloration nail and subungual debris and thickening of the nail. 0% Clearance of onychomycotic nail changes noted. Tenderness with walking and wearing shoes.  Neruologic: Grossly intact B/L  Musculoskeletal:   Assessment:  Painful onychomycotic nails 1 through 5 bilaterally. 2. Pain toes 1 through 5 bilaterally 3. Ingrown lateral nail border hallux nail left with spicule     Plan:  -Patient is tolerating Lamisil treatment for onychomycotic nails well.  No side effects noted. Will continue this treatment. -Rx: Lamisil 250 mg p.o. daily -Venipuncture today for liver function tests to monitor for any hepatic side effects from the Lamisil.  - Return in 2 weeks matrixectomy lateral border hallux nail right.  Then 4 weeks nail check and Lamisil No. 3

## 2023-06-10 ENCOUNTER — Telehealth: Payer: Self-pay

## 2023-06-10 NOTE — Telephone Encounter (Signed)
 Her appt is for a sleep consult and this is appropriate since APPs are allowed to see sleep consults. I will close this encounter.

## 2023-06-10 NOTE — Telephone Encounter (Signed)
 Copied from CRM [id number]. Topic: Referral - Pulmonology Self-Referral >> Jun 10, 2023  8:15 AM Hilton Lucky wrote: This patient has scheduled a self-referred appointment. See attached appointment information and form for details.

## 2023-06-16 ENCOUNTER — Ambulatory Visit: Payer: MEDICAID | Admitting: Podiatry

## 2023-08-08 ENCOUNTER — Other Ambulatory Visit: Payer: Self-pay

## 2023-08-08 ENCOUNTER — Emergency Department (HOSPITAL_COMMUNITY): Payer: MEDICAID

## 2023-08-08 ENCOUNTER — Emergency Department (HOSPITAL_COMMUNITY)
Admission: EM | Admit: 2023-08-08 | Discharge: 2023-08-08 | Disposition: A | Discharge status: Home / Self Care | Payer: MEDICAID | Attending: Emergency Medicine | Admitting: Emergency Medicine

## 2023-08-08 DIAGNOSIS — R0789 Other chest pain: Secondary | ICD-10-CM | POA: Insufficient documentation

## 2023-08-08 DIAGNOSIS — I10 Essential (primary) hypertension: Secondary | ICD-10-CM | POA: Insufficient documentation

## 2023-08-08 DIAGNOSIS — J45909 Unspecified asthma, uncomplicated: Secondary | ICD-10-CM | POA: Insufficient documentation

## 2023-08-08 DIAGNOSIS — R079 Chest pain, unspecified: Secondary | ICD-10-CM | POA: Diagnosis present

## 2023-08-08 LAB — COMPREHENSIVE METABOLIC PANEL WITH GFR
ALT: 11 U/L (ref 0–44)
AST: 13 U/L — ABNORMAL LOW (ref 15–41)
Albumin: 3.8 g/dL (ref 3.5–5.0)
Alkaline Phosphatase: 77 U/L (ref 38–126)
Anion gap: 12 (ref 5–15)
BUN: 12 mg/dL (ref 6–20)
CO2: 22 mmol/L (ref 22–32)
Calcium: 9.1 mg/dL (ref 8.9–10.3)
Chloride: 103 mmol/L (ref 98–111)
Creatinine, Ser: 0.56 mg/dL (ref 0.44–1.00)
GFR, Estimated: >60 mL/min (ref >60)
Glucose, Bld: 112 mg/dL — ABNORMAL HIGH (ref 70–99)
Potassium: 3.8 mmol/L (ref 3.5–5.1)
Sodium: 137 mmol/L (ref 135–145)
Total Bilirubin: 0.7 mg/dL (ref 0.0–1.2)
Total Protein: 7.3 g/dL (ref 6.5–8.1)

## 2023-08-08 LAB — CBC WITH DIFFERENTIAL/PLATELET
Abs Immature Granulocytes: 0.04 K/uL (ref 0.00–0.07)
Basophils Absolute: 0.1 K/uL (ref 0.0–0.1)
Basophils Relative: 1 %
Eosinophils Absolute: 0.2 K/uL (ref 0.0–0.5)
Eosinophils Relative: 2 %
HCT: 45 % (ref 36.0–46.0)
Hemoglobin: 14.1 g/dL (ref 12.0–15.0)
Immature Granulocytes: 0 %
Lymphocytes Relative: 28 %
Lymphs Abs: 2.7 K/uL (ref 0.7–4.0)
MCH: 24.4 pg — ABNORMAL LOW (ref 26.0–34.0)
MCHC: 31.3 g/dL (ref 30.0–36.0)
MCV: 77.9 fL — ABNORMAL LOW (ref 80.0–100.0)
Monocytes Absolute: 0.6 K/uL (ref 0.1–1.0)
Monocytes Relative: 6 %
Neutro Abs: 6 K/uL (ref 1.7–7.7)
Neutrophils Relative %: 63 %
Platelets: 226 K/uL (ref 150–400)
RBC: 5.78 MIL/uL — ABNORMAL HIGH (ref 3.87–5.11)
RDW: 16.4 % — ABNORMAL HIGH (ref 11.5–15.5)
WBC: 9.5 K/uL (ref 4.0–10.5)
nRBC: 0 % (ref 0.0–0.2)

## 2023-08-08 LAB — TROPONIN I (HIGH SENSITIVITY): Troponin I (High Sensitivity): 3 ng/L (ref <18)

## 2023-08-08 LAB — D-DIMER, QUANTITATIVE: D-Dimer, Quant: <0.27 ug{FEU}/mL (ref 0.00–0.50)

## 2023-08-08 MED ORDER — ONDANSETRON 4 MG PO TBDP
4.0000 mg | ORAL_TABLET | Freq: Once | ORAL | Status: AC
  Administered 2023-08-08: 4 mg via ORAL
  Filled 2023-08-08: qty 1

## 2023-08-08 MED ORDER — ACETAMINOPHEN 500 MG PO TABS
1000.0000 mg | ORAL_TABLET | Freq: Once | ORAL | Status: AC
  Administered 2023-08-08: 1000 mg via ORAL
  Filled 2023-08-08: qty 2

## 2023-08-08 MED ORDER — FAMOTIDINE 20 MG PO TABS
20.0000 mg | ORAL_TABLET | Freq: Once | ORAL | Status: AC
  Administered 2023-08-08: 20 mg via ORAL
  Filled 2023-08-08: qty 1

## 2023-08-08 NOTE — Discharge Instructions (Addendum)
 Evaluation for your chest pain was overall reassuring.  Recommend Tylenol  ibuprofen  at home and follow-up with PCP.  If your symptoms worsen or change in any way please return to the ED for further evaluation.

## 2023-08-08 NOTE — ED Triage Notes (Signed)
 Pt complaining of sharp chest pain that has come and gone for 1 week. Pt describes the pain as sharp and pinpoints the location under her left breast. Pt has a history of AFIB, but is not on any medication.

## 2023-08-08 NOTE — ED Provider Notes (Signed)
 [place] EMERGENCY DEPARTMENT AT [facility] Provider Note   CSN: [id number] Arrival date & time: 08/08/23  9291     Patient presents with: Chest Pain  HPI Monique Fowler is a 58 y.o. female with history of cocaine use, GERD, alcohol dependence, bipolar 1, hypertension, asthma presenting for chest pain.  Started 1 week ago.  Its intermittent but much worse this morning.  It is sharp pain.  She localizes the pain just under her left rib right on the bone.  It is reproducible and worse with movement.  It can also be worse with deep breaths.  Denies any recent cocaine use.  Denies shortness of breath.  Denies abdominal pain, nausea vomiting diarrhea and urinary symptoms.    Chest Pain      Prior to Admission medications   Medication Sig Start Date End Date Taking? Authorizing Provider  albuterol  (PROVENTIL  HFA;VENTOLIN  HFA) 108 (90 Base) MCG/ACT inhaler Inhale 1-2 puffs into the lungs every 6 (six) hours as needed for wheezing or shortness of breath.    [provider]  allopurinol (ZYLOPRIM) 300 MG tablet Take 300 mg by mouth daily. 07/18/20   [provider]  ammonium lactate (LAC-HYDRIN) 12 % lotion Apply 1 application topically daily. Patient not taking: Reported on 05/26/2023 08/08/20   [provider]  atorvastatin (LIPITOR) 20 MG tablet Take 20 mg by mouth daily. 05/19/20   [provider]  azithromycin  (ZITHROMAX  Z-PAK) 250 MG tablet 2 po day one, then 1 daily x 4 days Patient not taking: Reported on 05/25/2018 05/01/18   Armenta Canning, MD  benzonatate  (TESSALON ) 100 MG capsule Take 1 capsule (100 mg total) by mouth every 8 (eight) hours. 05/01/18   Armenta Canning, MD  ciclopirox (PENLAC) 8 % solution Apply topically at bedtime. 07/12/20   [provider]  fluticasone  (FLONASE ) 50 MCG/ACT nasal spray Place 2 sprays into both nostrils daily. Patient not taking: Reported on 05/25/2018 06/24/17 07/24/17  Angelena Smalls, MD   HYDROcodone -homatropine (HYCODAN) 5-1.5 MG/5ML syrup 5-10mL every 6 hours as needed for severe cough 05/01/18   Armenta Canning, MD  lisinopril  (ZESTRIL ) 20 MG tablet Take 20 mg by mouth daily. Patient not taking: Reported on 05/26/2023 08/19/20   [provider]  lisinopril -hydrochlorothiazide  (PRINZIDE,ZESTORETIC) 20-12.5 MG tablet Take 1 tablet by mouth daily. Patient not taking: Reported on 05/26/2023 06/21/17   [provider]  meclizine  (ANTIVERT ) 25 MG tablet Take 1 tablet (25 mg total) by mouth 3 (three) times daily as needed for dizziness. 06/24/17   Angelena Smalls, MD  omeprazole  (PRILOSEC) 20 MG capsule Take 20 mg by mouth daily. 06/01/17   [provider]  ondansetron  (ZOFRAN ) 4 MG tablet Take 1 tablet (4 mg total) by mouth every 6 (six) hours. 02/08/22   Curatolo, Adam, DO  oxyCODONE -acetaminophen  (PERCOCET) 10-325 MG tablet Take 1 tablet by mouth every 4 (four) hours as needed for pain.    [provider]  predniSONE  (DELTASONE ) 50 MG tablet 1 p.o. daily x 5 12/19/22   Dasie Faden, MD  SYMBICORT 160-4.5 MCG/ACT inhaler Inhale 2 puffs into the lungs 2 (two) times daily. 08/23/20   [provider]    Allergies: Tramadol , Acetaminophen , and Aspirin    Review of Systems  Cardiovascular:  Positive for chest pain.     Physical Exam   Vitals:   08/08/23 0830 08/08/23 0930  BP: 113/68 114/85  Pulse: 69 74  Resp: (!) 21 17  Temp:    SpO2: 98% 91%  CONSTITUTIONAL:  well-appearing, NAD NEURO:  Alert and oriented x 3, CN 3-12 grossly intact EYES:  eyes equal and reactive ENT/NECK:  Supple, no stridor Chest: TTP about lower left quadrant of the chest CARDIO:  Regular rate and rhythm, appears well-perfused, radial pulses 2+ bilaterally PULM:  No respiratory distress, CTAB GI/GU:  non-distended, soft, non tender MSK/SPINE:  No gross deformities, no edema, moves all extremities  SKIN:  no rash, atraumatic  *Additional and/or pertinent  findings included in MDM below  (all labs ordered are listed, but only abnormal results are displayed) Labs Reviewed  CBC WITH DIFFERENTIAL/PLATELET - Abnormal; Notable for the following components:      Result Value   RBC 5.78 (*)    MCV 77.9 (*)    MCH 24.4 (*)    RDW 16.4 (*)    All other components within normal limits  COMPREHENSIVE METABOLIC PANEL WITH GFR - Abnormal; Notable for the following components:   Glucose, Bld 112 (*)    AST 13 (*)    All other components within normal limits  D-DIMER, QUANTITATIVE  TROPONIN I (HIGH SENSITIVITY)    EKG: None  Radiology: [facility] Chest Port 1 View Result Date: 08/08/2023 CLINICAL DATA:  Chest pain. EXAM: PORTABLE CHEST 1 VIEW COMPARISON:  Chest radiograph dated 12/19/2022. FINDINGS: Right lung base atelectasis. Developing infiltrate is less likely. There is no pleural effusion or pneumothorax. Top-normal cardiac size. No acute osseous pathology. IMPRESSION: Right lung base atelectasis versus less likely developing infiltrate. Electronically Signed   By: Vanetta Chou M.D.   On: 08/08/2023 09:52     Procedures   Medications Ordered in the ED  acetaminophen  (TYLENOL ) tablet 1,000 mg (1,000 mg Oral Given 08/08/23 0838)  ondansetron  (ZOFRAN -ODT) disintegrating tablet 4 mg (4 mg Oral Given 08/08/23 0839)  famotidine  (PEPCID ) tablet 20 mg (20 mg Oral Given 08/08/23 9160)                                    Medical Decision Making Amount and/or Complexity of Data Reviewed Labs: ordered. Radiology: ordered.  Risk OTC drugs. Prescription drug management.   Initial Impression and Ddx 58 year old well-appearing female presenting for chest pain.  Exam notable for tenderness to palpation to the left lower chest.  DDx includes PE, ACS, rib fracture, pneumothorax, aortic dissection, intra-abdominal pathology, other. Patient PMH that increases complexity of ED encounter:  history of cocaine use, GERD, alcohol dependence, bipolar 1,  hypertension, asthma  Interpretation of Diagnostics - I independent reviewed and interpreted the labs as followed: negative troponin and d dimer  - I independently visualized the following imaging with scope of interpretation limited to determining acute life threatening conditions related to emergency care: CXR, which revealed right lower lobe atelectasis versus less likely infiltrate  -I personally reviewed and interpreted EKG which revealed sinus rhythm  Patient Reassessment and Ultimate Disposition/Management On reassessment chest pain improved after treatment.  Workup was reassuring.  Suspect this is costochondritis or precordial catch.  X-ray was somewhat concerning for possible pneumonia with no evidence of emergent given the cough and fever and only mild incisional pain, agree with radiologist there is likely atelectasis. Advised treatment at home with PCP follow-up.  Discussed return precautions.  Discharged in good condition.  Patient management required discussion with the following services or consulting groups:  None  Complexity of Problems Addressed Acute complicated illness or Injury  Additional Data Reviewed and Analyzed Further history  obtained from: Past medical history and medications listed in the EMR and Prior ED visit notes  Patient Encounter Risk Assessment Consideration of hospitalization      Final diagnoses:  Chest wall pain    ED Discharge Orders     None          Lang Norleen POUR, PA-C 08/08/23 1005    Griselda Norris, MD 08/10/23 1205

## 2023-08-09 NOTE — Progress Notes (Addendum)
 [facility] - Clinic Note  08/17/2023   CHIEF COMPLAINT Patient presents for Retina Evaluation  HISTORY OF PRESENT ILLNESS: Monique Fowler is a 58 y.o. female who presents to the clinic today for:  HPI     Retina Evaluation   In both eyes.  This started 1 week ago.  Duration of 1 week.  I, the attending physician,  performed the HPI with the patient and updated documentation appropriately.        Comments   Ret eval per Rosina Amy PA pt  is reporting blurred vision she denies flashes has some floaters her last reading 111 last A!C unknown       Last edited by Valdemar Rogue, MD on 08/17/2023  8:55 PM.     Pt states she has no known history of retinal issues, nothing in the family she's aware of. Pt has a history of drug use, has been in recovery for 1.5 years. Pt admits to hx of crack use off and on starting when she was 20. Pt goes to a daily program [employer] for her recovery.   Referring physician: Amy Rosina SAILOR, PA [address] [place],  [place] [address]  HISTORICAL INFORMATION:  Selected notes from the MEDICAL RECORD NUMBER Referred by Rosina Amy, PA for diabetic eye exam / retina eval LEE:  Ocular Hx- PMH-   CURRENT MEDICATIONS: No current outpatient medications on file. (Ophthalmic Drugs)   No current facility-administered medications for this visit. (Ophthalmic Drugs)   Current Outpatient Medications (Other)  Medication Sig   albuterol  (PROVENTIL  HFA;VENTOLIN  HFA) 108 (90 Base) MCG/ACT inhaler Inhale 1-2 puffs into the lungs every 6 (six) hours as needed for wheezing or shortness of breath.   allopurinol (ZYLOPRIM) 300 MG tablet Take 300 mg by mouth daily.   ammonium lactate (LAC-HYDRIN) 12 % lotion Apply 1 application topically daily.   atorvastatin (LIPITOR) 20 MG tablet Take 20 mg by mouth daily.   benzonatate  (TESSALON ) 100 MG capsule Take 1 capsule (100 mg total) by mouth every 8 (eight) hours.   ciclopirox  (PENLAC) 8 % solution Apply topically at bedtime.   lisinopril  (ZESTRIL ) 20 MG tablet Take 20 mg by mouth daily.   lisinopril -hydrochlorothiazide  (PRINZIDE,ZESTORETIC) 20-12.5 MG tablet Take 1 tablet by mouth daily.   meclizine  (ANTIVERT ) 25 MG tablet Take 1 tablet (25 mg total) by mouth 3 (three) times daily as needed for dizziness.   omeprazole  (PRILOSEC) 20 MG capsule Take 20 mg by mouth daily.   ondansetron  (ZOFRAN ) 4 MG tablet Take 1 tablet (4 mg total) by mouth every 6 (six) hours.   oxyCODONE -acetaminophen  (PERCOCET) 10-325 MG tablet Take 1 tablet by mouth every 4 (four) hours as needed for pain.   predniSONE  (DELTASONE ) 50 MG tablet 1 p.o. daily x 5   SYMBICORT 160-4.5 MCG/ACT inhaler Inhale 2 puffs into the lungs 2 (two) times daily.   azithromycin  (ZITHROMAX  Z-PAK) 250 MG tablet 2 po day one, then 1 daily x 4 days (Patient not taking: Reported on 05/25/2018)   fluticasone  (FLONASE ) 50 MCG/ACT nasal spray Place 2 sprays into both nostrils daily. (Patient not taking: Reported on 05/25/2018)   HYDROcodone -homatropine (HYCODAN) 5-1.5 MG/5ML syrup 5-10mL every 6 hours as needed for severe cough   Current Facility-Administered Medications (Other)  Medication Route   triamcinolone  acetonide (KENALOG ) 10 MG/ML injection 10 mg Other   REVIEW OF SYSTEMS:  ALLERGIES Allergies  Allergen Reactions   Tramadol  Nausea And Vomiting and Other (See  Comments)   Acetaminophen  Nausea Only and Other (See Comments)   Aspirin Nausea Only   PAST MEDICAL HISTORY Past Medical History:  Diagnosis Date   Arthritis    right knee   Asthma    prn inhaler   Bipolar affective (HCC)    Breast mass, left 04/2016   Depression    GERD (gastroesophageal reflux disease)    no current med.   Hypertension    states BP sometimes under control with med., has been on med. x 3 yr.   Wears partial dentures    upper   Past Surgical History:  Procedure Laterality Date   BREAST BIOPSY Left 01/16/2016   BREAST  EXCISIONAL BIOPSY Left    CESAREAN SECTION     KNEE ARTHROSCOPY Right    KNEE SURGERY     FAMILY HISTORY Family History  Problem Relation Age of Onset   Breast cancer Cousin    SOCIAL HISTORY Social History   Tobacco Use   Smoking status: Every Day    Current packs/day: 0.00    Types: Cigarettes   Smokeless tobacco: Never   Tobacco comments:    5 cig./day  Substance Use Topics   Alcohol use: No   Drug use: No       OPHTHALMIC EXAM:  Base Eye Exam     Visual Acuity (Snellen - Linear)       Right Left   Dist cc 20/50 20/40 -2   Dist ph cc NI          Tonometry (Tonopen, 9:33 AM)       Right Left   Pressure 16 18         Pupils       Dark Light Shape React APD   Right 3 2 Round Brisk None   Left 3 2 Round Brisk None         Visual Fields       Left Right    Full Full         Extraocular Movement       Right Left    Full, Ortho Full, Ortho         Neuro/Psych     Oriented x3: Yes   Mood/Affect: Normal         Dilation     Both eyes: 2.5% Phenylephrine @ 9:33 AM           Slit Lamp and Fundus Exam     External Exam       Right Left   External Normal Normal         Slit Lamp Exam       Right Left   Lids/Lashes Dermatochalasis Dermatochalasis   Conjunctiva/Sclera mild melanosis mild melanosis   Cornea Clear 1-2+ Punctate epithelial erosions   Anterior Chamber Deep and clear Deep and clear   Iris Round and dilated Round and dilated   Lens 2+ Cortical cataract, 2+ Nuclear sclerosis 2+ Cortical cataract, 2+ Nuclear sclerosis   Anterior Vitreous Vitreous syneresis Vitreous syneresis         Fundus Exam       Right Left   Disc Pink and Sharp Mild temporal pallor, sharp rim   C/D Ratio 0.3 0.3   Macula Flat, good foveal reflex, RPE mottling, diffuse atrophy, no heme or edema. Flat, good foveal reflex, RPE mottling and clumping, diffuse atrophy, no heme or edema.   Vessels Attenuated, mild tortuosity Attenuated,  Tortuous   Periphery Attached, no heme Attached,  no heme, focal pigmented CR scarring nasal and temporal periphery.           Refraction     Wearing Rx       Sphere Cylinder Axis Add   Right -1.75 +0.50 151 +2.50   Left -1.75 +0.25 172 +2.50         Manifest Refraction       Sphere Cylinder Axis Dist VA Add   Right -2.00 +0.50 150 20/40 +2.25   Left -2.50 +0.50 175 20/40 +2.50           IMAGING AND PROCEDURES  Imaging and Procedures for 08/17/2023  OCT, Retina - OU - Both Eyes       Right Eye Quality was good. Central Foveal Thickness: 181. Progression has no prior data. Findings include normal foveal contour, no IRF, no SRF, retinal drusen , outer retinal atrophy (Diffuse retinal atrophy and ellipsoid loss. ).   Left Eye Quality was good. Central Foveal Thickness: 206. Progression has no prior data. Findings include normal foveal contour, no IRF, no SRF, retinal drusen , outer retinal atrophy (Diffuse retinal atrophy and ellipsoid loss. ).   Notes *Images captured and stored on drive  Diagnosis / Impression:  Diffuse retinal atrophy and ellipsoid loss OU No DME OU  Clinical management:  See below  Abbreviations: NFP - Normal foveal profile. CME - cystoid macular edema. PED - pigment epithelial detachment. IRF - intraretinal fluid. SRF - subretinal fluid. EZ - ellipsoid zone. ERM - epiretinal membrane. ORA - outer retinal atrophy. ORT - outer retinal tubulation. SRHM - subretinal hyper-reflective material. IRHM - intraretinal hyper-reflective material      Fluorescein  Angiography Optos (Transit OS)       Right Eye Progression has no prior data. Early phase findings include staining. Mid/Late phase findings include staining (Focal diffuse staining posteriorly. No leakage. ).   Left Eye Progression has no prior data. Early phase findings include delayed filling, staining. Mid/Late phase findings include staining (Focal diffuse staining posteriorly and nasal  and temporal periphery. No leakage. ).   Notes **Images stored on drive**  Impression: OD: Focal diffuse staining posteriorly. No leakage.  OS: severely delayed filling time. Focal diffuse staining posteriorly and nasal and temporal periphery. No leakage.           ASSESSMENT/PLAN:   ICD-10-CM   1. Retinal macular atrophy  H35.89 OCT, Retina - OU - Both Eyes    2. Retinal ischemia  H35.82 Fluorescein  Angiography Optos (Transit OS)    3. Diabetes mellitus type 2 without retinopathy (HCC)  E11.9     4. Essential hypertension  I10     5. Hypertensive retinopathy of both eyes  H35.033 OCT, Retina - OU - Both Eyes    Fluorescein  Angiography Optos (Transit OS)    6. Combined forms of age-related cataract of both eyes  H25.813      1,2. 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  - OCT shows Diffuse retinal atrophy and ellipsoid loss OU - FA show severely delayed filling time and diffuse staining in areas of retinal atrophy -- suggestive of ischemia - discussed findings, prognosis - Pt reports history of crack cocaine usage, on and off from the time she was 58 y.o. Pt has now been in recovery 1.5 years.  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 Differential includes retinal dystrophy, inflammatory etiology or other - No intervention recommended at this time, will monitor. - F/u 6  months, DFE, OCT.  3. 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   - The need for close monitoring of blood glucose, blood pressure, and serum lipids, avoiding cigarette or any type of tobacco, and the need for long term follow up was also discussed with patient. - f/u in 1 year, sooner prn   4,5. Hypertensive retinopathy OU -  discussed importance of tight BP control - monitor  6. Mixed Cataract OU - The symptoms of cataract, surgical options, and treatments and risks were discussed with patient. - discussed diagnosis and progression - monitor  Ophthalmic Meds Ordered this visit:  No orders of the defined types were placed in this encounter.    Return in about 6 months (around 02/17/2024) for retinal atrophy OU, DFE, OCT.  There are no Patient Instructions on file for this visit.  Explained the diagnoses, plan, and follow up with the patient and they expressed understanding.  Patient expressed understanding of the importance of proper follow up care.   This document serves as a record of services personally performed by Redell JUDITHANN Hans, MD, PhD. It was created on their behalf by Avelina Pereyra, COA an ophthalmic technician. The creation of this record is the provider's dictation and/or activities during the visit.   Electronically signed by: Avelina GORMAN Pereyra, COT  08/17/23  9:08 PM   Redell JUDITHANN Hans, M.D., Ph.D. Diseases & Surgery of the Retina and Vitreous [facility] 08/17/2023  I have reviewed the above documentation for accuracy and completeness, and I agree with the above. Redell JUDITHANN Hans, M.D., Ph.D. 08/17/23 9:08 PM   Abbreviations: M myopia (nearsighted); A astigmatism; H hyperopia (farsighted); P presbyopia; Mrx spectacle prescription;  CTL contact lenses; OD right eye; OS left eye; OU both eyes  XT exotropia; ET esotropia; PEK punctate epithelial keratitis; PEE punctate epithelial erosions; DES dry eye syndrome; MGD meibomian gland dysfunction; ATs artificial tears; PFAT's preservative free artificial tears; NSC nuclear sclerotic cataract; PSC posterior subcapsular cataract; ERM epi-retinal membrane; PVD posterior vitreous detachment; RD retinal detachment; DM diabetes mellitus; DR diabetic retinopathy; NPDR non-proliferative diabetic retinopathy; PDR proliferative diabetic  retinopathy; CSME clinically significant macular edema; DME diabetic macular edema; dbh dot blot hemorrhages; CWS cotton wool spot; POAG primary open angle glaucoma; C/D cup-to-disc ratio; HVF humphrey visual field; GVF goldmann visual field; OCT optical coherence tomography; IOP intraocular pressure; BRVO Branch retinal vein occlusion; CRVO central retinal vein occlusion; CRAO central retinal artery occlusion; BRAO branch retinal artery occlusion; RT retinal tear; SB scleral buckle; PPV pars plana vitrectomy; VH Vitreous hemorrhage; PRP panretinal laser photocoagulation; IVK intravitreal kenalog ; VMT vitreomacular traction; MH Macular hole;  NVD neovascularization of the disc; NVE neovascularization elsewhere; AREDS age related eye disease study; ARMD age related macular degeneration; POAG primary open angle glaucoma; EBMD epithelial/anterior basement membrane dystrophy; ACIOL anterior chamber intraocular lens; IOL intraocular lens; PCIOL posterior chamber intraocular lens; Phaco/IOL phacoemulsification with intraocular lens placement; PRK photorefractive keratectomy; LASIK laser assisted in situ keratomileusis; HTN hypertension; DM diabetes mellitus; COPD chronic obstructive pulmonary disease

## 2023-08-17 ENCOUNTER — Ambulatory Visit (INDEPENDENT_AMBULATORY_CARE_PROVIDER_SITE_OTHER): Payer: MEDICAID | Admitting: Ophthalmology

## 2023-08-17 ENCOUNTER — Encounter (INDEPENDENT_AMBULATORY_CARE_PROVIDER_SITE_OTHER): Payer: Self-pay | Admitting: Ophthalmology

## 2023-08-17 VITALS — BP 134/90 | HR 75

## 2023-08-17 DIAGNOSIS — E119 Type 2 diabetes mellitus without complications: Secondary | ICD-10-CM

## 2023-08-17 DIAGNOSIS — H35033 Hypertensive retinopathy, bilateral: Secondary | ICD-10-CM | POA: Diagnosis not present

## 2023-08-17 DIAGNOSIS — H3581 Retinal edema: Secondary | ICD-10-CM

## 2023-08-17 DIAGNOSIS — H3589 Other specified retinal disorders: Secondary | ICD-10-CM

## 2023-08-17 DIAGNOSIS — I10 Essential (primary) hypertension: Secondary | ICD-10-CM | POA: Diagnosis not present

## 2023-08-17 DIAGNOSIS — H3582 Retinal ischemia: Secondary | ICD-10-CM

## 2023-08-17 DIAGNOSIS — H25813 Combined forms of age-related cataract, bilateral: Secondary | ICD-10-CM

## 2023-09-09 ENCOUNTER — Ambulatory Visit: Payer: MEDICAID | Admitting: Nurse Practitioner

## 2023-10-31 ENCOUNTER — Ambulatory Visit (HOSPITAL_COMMUNITY)
Admission: EM | Admit: 2023-10-31 | Discharge: 2023-10-31 | Disposition: A | Discharge status: Home / Self Care | Payer: MEDICAID

## 2023-10-31 DIAGNOSIS — H578A2 Foreign body sensation, left eye: Secondary | ICD-10-CM

## 2023-10-31 MED ORDER — TETRACAINE HCL 0.5 % OP SOLN
OPHTHALMIC | Status: AC
  Filled 2023-10-31: qty 4

## 2023-10-31 NOTE — ED Triage Notes (Signed)
 Bilateral eye redness and itching x 1 month. Patient states she was prescribed eye ointment x 4 days ago. No relief. Patient wears glasses and can not see eye chart.

## 2023-10-31 NOTE — ED Provider Notes (Signed)
 [facility]    CSN: [id number] Arrival date & time: 10/31/23  1010      History   Chief Complaint Chief Complaint  Patient presents with   Eye Problem    HPI Monique Fowler is a 58 y.o. female.   Patient presents today with 1 month history of left eye foreign body sensation.  She denies blurred vision or double vision, headache, photophobia, or floaters in the vision.  No visual changes as long as she is wearing her glasses.  She has tried Visine and antibiotic ointment as prescribed by primary care provider's office for the last 3 weeks without improvement.  No history of eye trauma, does not wear contact lenses.  She touches her eye frequently which does not seem to help very much.  She has not reached out to her eye doctor yet.    Past Medical History:  Diagnosis Date   Arthritis    right knee   Asthma    prn inhaler   Bipolar affective (HCC)    Breast mass, left 04/2016   Depression    GERD (gastroesophageal reflux disease)    no current med.   Hypertension    states BP sometimes under control with med., has been on med. x 3 yr.   Wears partial dentures    upper    Patient Active Problem List   Diagnosis Date Noted   Breast abscess 03/26/2017   Encounter for smoking cessation counseling 02/25/2017   Seasonal allergic rhinitis due to pollen 02/25/2017   Shortness of breath 02/25/2017   Chronic pain of right knee 02/24/2017   Chronic pain syndrome 02/24/2017   Low back pain of over 3 months duration 02/24/2017   Myofascial pain 02/24/2017   Epigastric pain 02/08/2017   Cocaine abuse with cocaine-induced mood disorder (HCC) 09/16/2015   Alcohol dependence with uncomplicated withdrawal (HCC)    Substance induced mood disorder (HCC) 07/09/2014   Alcohol dependence (HCC) 07/09/2014   Cocaine dependence with cocaine-induced mood disorder (HCC) 07/09/2014   Bipolar I disorder, most recent episode depressed (HCC) 07/09/2014   Fibroids 08/12/2010    Dysmenorrhea 08/12/2010   Adenomyosis 08/12/2010   Menorrhagia 08/12/2010   Smoker 08/12/2010   GERD (gastroesophageal reflux disease) 08/12/2010   Depression 08/12/2010    Past Surgical History:  Procedure Laterality Date   BREAST BIOPSY Left 01/16/2016   BREAST EXCISIONAL BIOPSY Left    CESAREAN SECTION     KNEE ARTHROSCOPY Right    KNEE SURGERY      OB History   No obstetric history on file.      Home Medications    Prior to Admission medications   Medication Sig Start Date End Date Taking? Authorizing Provider  albuterol  (PROVENTIL  HFA;VENTOLIN  HFA) 108 (90 Base) MCG/ACT inhaler Inhale 1-2 puffs into the lungs every 6 (six) hours as needed for wheezing or shortness of breath.   Yes [provider]  allopurinol (ZYLOPRIM) 300 MG tablet Take 300 mg by mouth daily. 07/18/20  Yes [provider]  atorvastatin (LIPITOR) 20 MG tablet Take 20 mg by mouth daily. 05/19/20  Yes [provider]  lisinopril  (ZESTRIL ) 20 MG tablet Take 20 mg by mouth daily. 08/19/20  Yes [provider]  lisinopril -hydrochlorothiazide  (PRINZIDE,ZESTORETIC) 20-12.5 MG tablet Take 1 tablet by mouth daily. 06/21/17  Yes [provider]  meclizine  (ANTIVERT ) 25 MG tablet Take 1 tablet (25 mg total) by mouth 3 (three) times daily as needed for dizziness. 06/24/17  Yes Isaacs,  Ole, MD  omeprazole  (PRILOSEC) 20 MG capsule Take 20 mg by mouth daily. 06/01/17  Yes [provider]  ondansetron  (ZOFRAN ) 4 MG tablet Take 1 tablet (4 mg total) by mouth every 6 (six) hours. 02/08/22  Yes Curatolo, Adam, DO  oxyCODONE -acetaminophen  (PERCOCET) 10-325 MG tablet Take 1 tablet by mouth every 4 (four) hours as needed for pain.   Yes [provider]  SYMBICORT 160-4.5 MCG/ACT inhaler Inhale 2 puffs into the lungs 2 (two) times daily. 08/23/20  Yes [provider]  ammonium lactate (LAC-HYDRIN) 12 % lotion Apply 1 application topically daily. 08/08/20   [provider]  azithromycin  (ZITHROMAX  Z-PAK) 250 MG tablet 2 po day one, then 1 daily x 4 days Patient not taking: Reported on 05/25/2018 05/01/18   Armenta Canning, MD  benzonatate  (TESSALON ) 100 MG capsule Take 1 capsule (100 mg total) by mouth every 8 (eight) hours. 05/01/18   Armenta Canning, MD  ciclopirox (PENLAC) 8 % solution Apply topically at bedtime. 07/12/20   [provider]  fluticasone  (FLONASE ) 50 MCG/ACT nasal spray Place 2 sprays into both nostrils daily. Patient not taking: Reported on 05/25/2018 06/24/17 07/24/17  Angelena Ole, MD  HYDROcodone -homatropine (HYCODAN) 5-1.5 MG/5ML syrup 5-10mL every 6 hours as needed for severe cough 05/01/18   Armenta Canning, MD  predniSONE  (DELTASONE ) 50 MG tablet 1 p.o. daily x 5 12/19/22   Dasie Faden, MD    Family History Family History  Problem Relation Age of Onset   Breast cancer Cousin     Social History Social History   Tobacco Use   Smoking status: Every Day    Current packs/day: 0.00    Types: Cigarettes   Smokeless tobacco: Never   Tobacco comments:    5 cig./day  Substance Use Topics   Alcohol use: No   Drug use: No     Allergies   Tramadol , Acetaminophen , and Aspirin   Review of Systems Review of Systems Per HPI  Physical Exam Triage Vital Signs ED Triage Vitals  Encounter Vitals Group     BP 10/31/23 1057 (!) 143/87     Girls Systolic BP Percentile --      Girls Diastolic BP Percentile --      Boys Systolic BP Percentile --      Boys Diastolic BP Percentile --      Pulse Rate 10/31/23 1057 94     Resp 10/31/23 1057 20     Temp 10/31/23 1057 98 F (36.7 C)     Temp Source 10/31/23 1057 Oral     SpO2 10/31/23 1100 93 %     Weight --      Height --      Head Circumference --      Peak Flow --      Pain Score 10/31/23 1059 0     Pain Loc --      Pain Education --      Exclude from Growth Chart --    No data found.  Updated Vital Signs BP (!) 143/87 (BP Location: Left Arm)   Pulse  94   Temp 98 F (36.7 C) (Oral)   Resp 20   LMP 08/13/2012   SpO2 93%   Visual Acuity Right Eye Distance:   Left Eye Distance:   Bilateral Distance:    Right Eye Near:   Left Eye Near:    Bilateral Near:     Physical Exam Vitals and nursing note reviewed.  Constitutional:  General: She is not in acute distress.    Appearance: Normal appearance. She is not toxic-appearing.  HENT:     Head: Normocephalic and atraumatic.     Right Ear: External ear normal.     Left Ear: External ear normal.     Mouth/Throat:     Mouth: Mucous membranes are moist.     Pharynx: Oropharynx is clear.  Eyes:     General: No scleral icterus.       Right eye: No foreign body or discharge.        Left eye: No foreign body or discharge.     Extraocular Movements: Extraocular movements intact.     Right eye: Normal extraocular motion.     Left eye: Normal extraocular motion.     Conjunctiva/sclera:     Right eye: Right conjunctiva is not injected.     Left eye: Left conjunctiva is not injected.     Pupils: Pupils are equal, round, and reactive to light.     Right eye: No corneal abrasion or fluorescein  uptake.     Left eye: No corneal abrasion or fluorescein  uptake.     Comments: No corneal abrasion or ulcer  Cardiovascular:     Rate and Rhythm: Normal rate.  Pulmonary:     Effort: Pulmonary effort is normal. No respiratory distress.  Musculoskeletal:     Cervical back: Normal range of motion.  Lymphadenopathy:     Cervical: No cervical adenopathy.  Skin:    General: Skin is warm and dry.     Capillary Refill: Capillary refill takes less than 2 seconds.     Coloration: Skin is not jaundiced or pale.     Findings: No erythema.  Neurological:     Mental Status: She is alert and oriented to person, place, and time.  Psychiatric:        Behavior: Behavior is cooperative.      UC Treatments / Results  Labs (all labs ordered are listed, but only abnormal results are displayed) Labs  Reviewed - No data to display  EKG   Radiology No results found.  Procedures Procedures (including critical care time)  Medications Ordered in UC Medications - No data to display  Initial Impression / Assessment and Plan / UC Course  I have reviewed the triage vital signs and the nursing notes.  Pertinent labs & imaging results that were available during my care of the patient were reviewed by me and considered in my medical decision making (see chart for details).   Patient is well-appearing, normotensive, afebrile, not tachycardic, not tachypneic, oxygenating well on room air.   1. Sensation of foreign body in left eye No red flags Eye exam does not reveal corneal abrasion or ulcer today Suspect possible dry eye, treat blink eyedrops and recommended avoidance of messing with her eye Follow-up with ophthalmologist if symptoms persist despite treatment  The patient was given the opportunity to ask questions.  All questions answered to their satisfaction.  The patient is in agreement to this plan.   Final Clinical Impressions(s) / UC Diagnoses   Final diagnoses:  Sensation of foreign body in left eye     Discharge Instructions      I did not see a corneal abrasion or ulcer on the surface of your eye today.  There is a little bit of irritation on the surface of your eye.  Start using Blink Lubricating eye drops in your left eye and monitor for improvement.  If symptoms  do not improve, follow up with Eye Doctor this week.     ED Prescriptions   None    PDMP not reviewed this encounter.   Chandra Harlene LABOR, NP 10/31/23 1151

## 2023-10-31 NOTE — Discharge Instructions (Signed)
 I did not see a corneal abrasion or ulcer on the surface of your eye today.  There is a little bit of irritation on the surface of your eye.  Start using Blink Lubricating eye drops in your left eye and monitor for improvement.  If symptoms do not improve, follow up with Eye Doctor this week.

## 2023-11-03 ENCOUNTER — Ambulatory Visit (HOSPITAL_COMMUNITY)
Admission: EM | Admit: 2023-11-03 | Discharge: 2023-11-03 | Disposition: A | Discharge status: Home / Self Care | Payer: MEDICAID

## 2023-11-03 ENCOUNTER — Encounter (HOSPITAL_COMMUNITY): Payer: Self-pay

## 2023-11-03 DIAGNOSIS — B370 Candidal stomatitis: Secondary | ICD-10-CM | POA: Diagnosis not present

## 2023-11-03 MED ORDER — CLOTRIMAZOLE 10 MG MT TROC: 10.0000 mg | Freq: Every day | OROMUCOSAL | 0 refills | Status: AC

## 2023-11-03 NOTE — ED Triage Notes (Signed)
 Pt states left ear and throat pain for the past 3 days.  States she thinks she has a sore on the back right of her left tongue.

## 2023-11-03 NOTE — ED Provider Notes (Signed)
 [facility]  Note:  This document was prepared using Conservation officer, historic buildings and may include unintentional dictation errors.  MRN: [id number] DOB: [birth year]  Subjective:   Monique Fowler is a 58 y.o. female presenting for evaluation of left-sided tongue/throat pain with mild left ear pressure/pain x 3 days.  Patient reports that she sees whitish colored patch on the side of her tongue on the left side reports that this area is very sore and hurts with eating food.  Patient reports she does use Symbicort inhaler and albuterol  inhaler for asthma symptoms however she has not been washing her mouth out after using Symbicort which she was advised is a increased risk for oral thrush.  Patient denies any other secondary symptoms, no nasal congestion, cough, body aches, fatigue, fever.   Current Facility-Administered Medications:    triamcinolone  acetonide (KENALOG ) 10 MG/ML injection 10 mg, 10 mg, Other, Once, [place], [place] RAMAN, DPM  Current Outpatient Medications:    clotrimazole (MYCELEX) 10 MG troche, Take 1 tablet (10 mg total) by mouth 5 (five) times daily for 10 days., Disp: 50 tablet, Rfl: 0   albuterol  (PROVENTIL  HFA;VENTOLIN  HFA) 108 (90 Base) MCG/ACT inhaler, Inhale 1-2 puffs into the lungs every 6 (six) hours as needed for wheezing or shortness of breath., Disp: , Rfl:    allopurinol (ZYLOPRIM) 300 MG tablet, Take 300 mg by mouth daily., Disp: , Rfl:    ammonium lactate (LAC-HYDRIN) 12 % lotion, Apply 1 application topically daily., Disp: , Rfl:    atorvastatin (LIPITOR) 20 MG tablet, Take 20 mg by mouth daily., Disp: , Rfl:    azithromycin  (ZITHROMAX  Z-PAK) 250 MG tablet, 2 po day one, then 1 daily x 4 days (Patient not taking: Reported on 05/25/2018), Disp: 5 tablet, Rfl: 0   benzonatate  (TESSALON ) 100 MG capsule, Take 1 capsule (100 mg total) by mouth every 8 (eight) hours., Disp: 21 capsule, Rfl: 0   ciclopirox (PENLAC) 8 % solution, Apply topically at  bedtime., Disp: , Rfl:    fluticasone  (FLONASE ) 50 MCG/ACT nasal spray, Place 2 sprays into both nostrils daily. (Patient not taking: Reported on 05/25/2018), Disp: 16 g, Rfl: 0   HYDROcodone -homatropine (HYCODAN) 5-1.5 MG/5ML syrup, 5-10mL every 6 hours as needed for severe cough, Disp: 120 mL, Rfl: 0   lisinopril  (ZESTRIL ) 20 MG tablet, Take 20 mg by mouth daily., Disp: , Rfl:    lisinopril -hydrochlorothiazide  (PRINZIDE,ZESTORETIC) 20-12.5 MG tablet, Take 1 tablet by mouth daily., Disp: , Rfl: 2   meclizine  (ANTIVERT ) 25 MG tablet, Take 1 tablet (25 mg total) by mouth 3 (three) times daily as needed for dizziness., Disp: 30 tablet, Rfl: 0   omeprazole  (PRILOSEC) 20 MG capsule, Take 20 mg by mouth daily., Disp: , Rfl: 3   ondansetron  (ZOFRAN ) 4 MG tablet, Take 1 tablet (4 mg total) by mouth every 6 (six) hours., Disp: 12 tablet, Rfl: 0   oxyCODONE -acetaminophen  (PERCOCET) 10-325 MG tablet, Take 1 tablet by mouth every 4 (four) hours as needed for pain., Disp: , Rfl:    predniSONE  (DELTASONE ) 50 MG tablet, 1 p.o. daily x 5, Disp: 5 tablet, Rfl: 0   SYMBICORT 160-4.5 MCG/ACT inhaler, Inhale 2 puffs into the lungs 2 (two) times daily., Disp: , Rfl:    Allergies  Allergen Reactions   Tramadol  Nausea And Vomiting and Other (See Comments)   Acetaminophen  Nausea Only and Other (See Comments)   Aspirin Nausea Only    Past Medical History:  Diagnosis Date   Arthritis  right knee   Asthma    prn inhaler   Bipolar affective (HCC)    Breast mass, left 04/2016   Depression    GERD (gastroesophageal reflux disease)    no current med.   Hypertension    states BP sometimes under control with med., has been on med. x 3 yr.   Wears partial dentures    upper     Past Surgical History:  Procedure Laterality Date   BREAST BIOPSY Left 01/16/2016   BREAST EXCISIONAL BIOPSY Left    CESAREAN SECTION     KNEE ARTHROSCOPY Right    KNEE SURGERY      Family History  Problem Relation Age of Onset    Breast cancer Cousin     Social History   Tobacco Use   Smoking status: Every Day    Current packs/day: 0.00    Types: Cigarettes   Smokeless tobacco: Never   Tobacco comments:    5 cig./day  Substance Use Topics   Alcohol use: No   Drug use: No    ROS Refer to HPI for ROS details.  Objective:    Vitals: BP 122/85 (BP Location: Left Arm)   Pulse 85   Temp 97.6 F (36.4 C) (Oral)   Resp 16   LMP 08/13/2012   SpO2 92%   Physical Exam Vitals and nursing note reviewed.  Constitutional:      General: She is not in acute distress.    Appearance: Normal appearance. She is well-developed. She is not ill-appearing or toxic-appearing.  HENT:     Head: Normocephalic and atraumatic.     Nose: Nose normal. No congestion or rhinorrhea.     Mouth/Throat:     Mouth: Mucous membranes are moist.     Tongue: Lesions (Mild leukoplakia possibly consistent with oral thrush secondary to use of Symbicort inhaler without washing mouth after use.) present.     Pharynx: Oropharynx is clear. Postnasal drip present. No oropharyngeal exudate or posterior oropharyngeal erythema.   Cardiovascular:     Rate and Rhythm: Normal rate.  Pulmonary:     Effort: Pulmonary effort is normal. No respiratory distress.  Musculoskeletal:        General: Normal range of motion.  Skin:    General: Skin is warm and dry.  Neurological:     General: No focal deficit present.     Mental Status: She is alert and oriented to person, place, and time.  Psychiatric:        Mood and Affect: Mood normal.        Behavior: Behavior normal.     Procedures  No results found for this or any previous visit (from the past 24 hours).  Assessment and Plan :     Discharge Instructions       1. Oral thrush (Primary) - clotrimazole (MYCELEX) 10 MG troche; Take 1 tablet (10 mg total) by mouth 5 (five) times daily for 10 days.  Dispense: 50 tablet; Refill: 0 - After use of Symbicort inhaler for asthma control,  wash mouth out thoroughly to prevent secondary thrush from occurring. - Do not use any alcohol-based mouthwash as this may cause more irritation to the mouth and tongue causing increased pain. -Continue to monitor symptoms for any change in severity if there is any escalation of current symptoms or development of new symptoms follow-up in ER for further evaluation and management.      Lorelle Macaluso B Jordis Repetto   [place], [place], [place] 11/03/23 1044

## 2023-11-03 NOTE — Discharge Instructions (Addendum)
  1. Oral thrush (Primary) - clotrimazole (MYCELEX) 10 MG troche; Take 1 tablet (10 mg total) by mouth 5 (five) times daily for 10 days.  Dispense: 50 tablet; Refill: 0 - After use of Symbicort inhaler for asthma control, wash mouth out thoroughly to prevent secondary thrush from occurring. - Do not use any alcohol-based mouthwash as this may cause more irritation to the mouth and tongue causing increased pain. -Continue to monitor symptoms for any change in severity if there is any escalation of current symptoms or development of new symptoms follow-up in ER for further evaluation and management.

## 2024-01-08 ENCOUNTER — Emergency Department (HOSPITAL_COMMUNITY): Payer: MEDICAID

## 2024-01-08 ENCOUNTER — Encounter (HOSPITAL_COMMUNITY): Payer: Self-pay | Admitting: Emergency Medicine

## 2024-01-08 ENCOUNTER — Other Ambulatory Visit: Payer: Self-pay

## 2024-01-08 ENCOUNTER — Emergency Department (HOSPITAL_COMMUNITY)
Admission: EM | Admit: 2024-01-08 | Discharge: 2024-01-08 | Disposition: A | Discharge status: Home / Self Care | Payer: MEDICAID | Attending: Emergency Medicine | Admitting: Emergency Medicine

## 2024-01-08 DIAGNOSIS — R0602 Shortness of breath: Secondary | ICD-10-CM | POA: Insufficient documentation

## 2024-01-08 DIAGNOSIS — R0789 Other chest pain: Secondary | ICD-10-CM | POA: Diagnosis not present

## 2024-01-08 DIAGNOSIS — R110 Nausea: Secondary | ICD-10-CM | POA: Diagnosis not present

## 2024-01-08 DIAGNOSIS — Z7951 Long term (current) use of inhaled steroids: Secondary | ICD-10-CM | POA: Insufficient documentation

## 2024-01-08 DIAGNOSIS — R059 Cough, unspecified: Secondary | ICD-10-CM | POA: Diagnosis not present

## 2024-01-08 DIAGNOSIS — I10 Essential (primary) hypertension: Secondary | ICD-10-CM | POA: Insufficient documentation

## 2024-01-08 DIAGNOSIS — Z79899 Other long term (current) drug therapy: Secondary | ICD-10-CM | POA: Insufficient documentation

## 2024-01-08 DIAGNOSIS — J45909 Unspecified asthma, uncomplicated: Secondary | ICD-10-CM | POA: Diagnosis not present

## 2024-01-08 LAB — BASIC METABOLIC PANEL WITH GFR
Anion gap: 10 (ref 5–15)
BUN: 8 mg/dL (ref 6–20)
CO2: 27 mmol/L (ref 22–32)
Calcium: 9.3 mg/dL (ref 8.9–10.3)
Chloride: 103 mmol/L (ref 98–111)
Creatinine, Ser: 0.63 mg/dL (ref 0.44–1.00)
GFR, Estimated: >60 mL/min
Glucose, Bld: 117 mg/dL — ABNORMAL HIGH (ref 70–99)
Potassium: 4.3 mmol/L (ref 3.5–5.1)
Sodium: 140 mmol/L (ref 135–145)

## 2024-01-08 LAB — D-DIMER, QUANTITATIVE: D-Dimer, Quant: 0.27 ug{FEU}/mL (ref 0.00–0.50)

## 2024-01-08 LAB — RESP PANEL BY RT-PCR (RSV, FLU A&B, COVID)  RVPGX2
Influenza A by PCR: NEGATIVE
Influenza B by PCR: NEGATIVE
Resp Syncytial Virus by PCR: NEGATIVE
SARS Coronavirus 2 by RT PCR: NEGATIVE

## 2024-01-08 LAB — CBC
HCT: 46.5 % — ABNORMAL HIGH (ref 36.0–46.0)
Hemoglobin: 14.7 g/dL (ref 12.0–15.0)
MCH: 25.4 pg — ABNORMAL LOW (ref 26.0–34.0)
MCHC: 31.6 g/dL (ref 30.0–36.0)
MCV: 80.3 fL (ref 80.0–100.0)
Platelets: 218 K/uL (ref 150–400)
RBC: 5.79 MIL/uL — ABNORMAL HIGH (ref 3.87–5.11)
RDW: 16.3 % — ABNORMAL HIGH (ref 11.5–15.5)
WBC: 8.9 K/uL (ref 4.0–10.5)
nRBC: 0 % (ref 0.0–0.2)

## 2024-01-08 LAB — PRO BRAIN NATRIURETIC PEPTIDE: Pro Brain Natriuretic Peptide: 439 pg/mL — ABNORMAL HIGH

## 2024-01-08 LAB — TROPONIN T, HIGH SENSITIVITY: Troponin T High Sensitivity: <15 ng/L (ref 0–19)

## 2024-01-08 MED ORDER — ACETAMINOPHEN 500 MG PO TABS
1000.0000 mg | ORAL_TABLET | Freq: Once | ORAL | Status: DC
  Filled 2024-01-08: qty 2

## 2024-01-08 MED ORDER — ONDANSETRON 8 MG PO TBDP
8.0000 mg | ORAL_TABLET | Freq: Once | ORAL | Status: AC
  Administered 2024-01-08: 8 mg via ORAL
  Filled 2024-01-08: qty 1

## 2024-01-08 NOTE — ED Triage Notes (Signed)
 Pt reports SHOB, cough, nausea x 1 week. Also c/o chest tightness & neck/left arm pain.

## 2024-01-08 NOTE — Discharge Instructions (Addendum)
 Evaluation for your chest pain and shortness of breath was reassuring.  Please follow-up with your PCP.  If your symptoms worsen or change in any way please return to the ED for further evaluation.  Suspect you may have a viral upper respiratory infection that could be contributing.  Would recommend that you continue assertive hydration with water and Gatorade at home and you can take Tylenol  and ibuprofen  as needed for fever control and symptomatic relief.

## 2024-01-08 NOTE — ED Provider Notes (Signed)
 " [place] [facility] AT [facility] Provider Note   CSN: [id number] Arrival date & time: 01/08/24  9349     Patient presents with: Shortness of Breath  HPI Monique Fowler is a 58 y.o. female with h/o asthma, hypertension, cocaine and alcohol use, bipolar 1 presenting for cough and shortness of breath and nausea.  Started about a week ago. She states she has had pain in the left upper back that radiates to her chest and down her left arm and upper left neck.  It is worse with movement of her left arm and laying on the left side.  Cough is productive with yellowish sputum.  Reports sick contact with her brother with similar symptoms in the last week.  Denies any lower extremity swelling or tenderness, history of blood clots, recent immobilization or surgeries.  Denies cocaine use today.    Shortness of Breath      Prior to Admission medications  Medication Sig Start Date End Date Taking? Authorizing Provider  albuterol  (PROVENTIL  HFA;VENTOLIN  HFA) 108 (90 Base) MCG/ACT inhaler Inhale 1-2 puffs into the lungs every 6 (six) hours as needed for wheezing or shortness of breath.    [provider]  allopurinol (ZYLOPRIM) 300 MG tablet Take 300 mg by mouth daily. 07/18/20   [provider]  ammonium lactate (LAC-HYDRIN) 12 % lotion Apply 1 application topically daily. 08/08/20   [provider]  atorvastatin (LIPITOR) 20 MG tablet Take 20 mg by mouth daily. 05/19/20   [provider]  azithromycin  (ZITHROMAX  Z-PAK) 250 MG tablet 2 po day one, then 1 daily x 4 days Patient not taking: Reported on 05/25/2018 05/01/18   Armenta Canning, MD  benzonatate  (TESSALON ) 100 MG capsule Take 1 capsule (100 mg total) by mouth every 8 (eight) hours. 05/01/18   Armenta Canning, MD  ciclopirox (PENLAC) 8 % solution Apply topically at bedtime. 07/12/20   [provider]  fluticasone  (FLONASE ) 50 MCG/ACT nasal spray Place 2 sprays into both nostrils  daily. Patient not taking: Reported on 05/25/2018 06/24/17 07/24/17  Angelena Smalls, MD  HYDROcodone -homatropine (HYCODAN) 5-1.5 MG/5ML syrup 5-10mL every 6 hours as needed for severe cough 05/01/18   Armenta Canning, MD  lisinopril  (ZESTRIL ) 20 MG tablet Take 20 mg by mouth daily. 08/19/20   [provider]  lisinopril -hydrochlorothiazide  (PRINZIDE,ZESTORETIC) 20-12.5 MG tablet Take 1 tablet by mouth daily. 06/21/17   [provider]  meclizine  (ANTIVERT ) 25 MG tablet Take 1 tablet (25 mg total) by mouth 3 (three) times daily as needed for dizziness. 06/24/17   Angelena Smalls, MD  omeprazole  (PRILOSEC) 20 MG capsule Take 20 mg by mouth daily. 06/01/17   [provider]  ondansetron  (ZOFRAN ) 4 MG tablet Take 1 tablet (4 mg total) by mouth every 6 (six) hours. 02/08/22   Curatolo, Adam, DO  oxyCODONE -acetaminophen  (PERCOCET) 10-325 MG tablet Take 1 tablet by mouth every 4 (four) hours as needed for pain.    [provider]  predniSONE  (DELTASONE ) 50 MG tablet 1 p.o. daily x 5 12/19/22   Dasie Faden, MD  SYMBICORT 160-4.5 MCG/ACT inhaler Inhale 2 puffs into the lungs 2 (two) times daily. 08/23/20   [provider]    Allergies: Tramadol , Acetaminophen , and Aspirin    Review of Systems  Respiratory:  Positive for shortness of breath.     Updated Vital Signs BP (!) 149/117   Pulse 93   Temp (!) 97.5 F (36.4 C) (Oral)   Resp 18  LMP 08/13/2012   SpO2 96%   Physical Exam Vitals and nursing note reviewed.  HENT:     Head: Normocephalic and atraumatic.     Mouth/Throat:     Mouth: Mucous membranes are moist.  Eyes:     General:        Right eye: No discharge.        Left eye: No discharge.     Conjunctiva/sclera: Conjunctivae normal.  Cardiovascular:     Rate and Rhythm: Normal rate and regular rhythm.     Pulses: Normal pulses.          Radial pulses are 2+ on the right side and 2+ on the left side.       Dorsalis pedis pulses are 2+ on the  right side and 2+ on the left side.     Heart sounds: Normal heart sounds.  Pulmonary:     Effort: Pulmonary effort is normal.     Breath sounds: Normal breath sounds and air entry. No decreased breath sounds, wheezing, rhonchi or rales.  Abdominal:     General: Abdomen is flat.     Palpations: Abdomen is soft.  Skin:    General: Skin is warm and dry.  Neurological:     General: No focal deficit present.  Psychiatric:        Mood and Affect: Mood normal.     (all labs ordered are listed, but only abnormal results are displayed) Labs Reviewed  BASIC METABOLIC PANEL WITH GFR - Abnormal; Notable for the following components:      Result Value   Glucose, Bld 117 (*)    All other components within normal limits  CBC - Abnormal; Notable for the following components:   RBC 5.79 (*)    HCT 46.5 (*)    MCH 25.4 (*)    RDW 16.3 (*)    All other components within normal limits  PRO BRAIN NATRIURETIC PEPTIDE - Abnormal; Notable for the following components:   Pro Brain Natriuretic Peptide 439.0 (*)    All other components within normal limits  RESP PANEL BY RT-PCR (RSV, FLU A&B, COVID)  RVPGX2  D-DIMER, QUANTITATIVE  TROPONIN T, HIGH SENSITIVITY    EKG: EKG Interpretation Date/Time:  Saturday January 08 2024 07:00:34 EST Ventricular Rate:  68 PR Interval:  147 QRS Duration:  82 QT Interval:  399 QTC Calculation: 425 R Axis:   61  Text Interpretation: Sinus rhythm Borderline low voltage, extremity leads Confirmed by Rogelia Satterfield ([id number]) on 01/08/2024 8:08:37 AM  Radiology: DG Chest 2 View Result Date: 01/08/2024 CLINICAL DATA:  Shortness of breath. EXAM: CHEST - 2 VIEW COMPARISON:  08/08/2023 FINDINGS: The cardio pericardial silhouette is enlarged. The lungs are clear without focal pneumonia, edema, pneumothorax or pleural effusion. No acute bony abnormality. Telemetry leads overlie the chest. IMPRESSION: Enlargement of the cardiopericardial silhouette. No acute  cardiopulmonary findings. Electronically Signed   By: Camellia Candle M.D.   On: 01/08/2024 08:53     Procedures   Medications Ordered in the ED  acetaminophen  (TYLENOL ) tablet 1,000 mg (1,000 mg Oral Not Given 01/08/24 0753)  ondansetron  (ZOFRAN -ODT) disintegrating tablet 8 mg (8 mg Oral Given 01/08/24 0734)    Clinical Course as of 01/08/24 0954  Sat Jan 08, 2024  0903 Pro Brain Natriuretic Peptide(!): 439.0 [JR]    Clinical Course User Index [JR] Lang Norleen POUR, PA-C  Medical Decision Making Amount and/or Complexity of Data Reviewed Labs: ordered. Decision-making details documented in ED Course. Radiology: ordered.  Risk OTC drugs. Prescription drug management.   Initial Impression and Ddx 58 year old well-appearing female presenting for shortness of breath and chest pain.  Exam was unremarkable.  DDx includes PE, ACS, CHF exacerbation, pneumonia, COPD exacerbation, URI, less likely dissection other. Patient PMH that increases complexity of ED encounter:  h/o asthma, hypertension, cocaine and alcohol use, bipolar 1  Interpretation of Diagnostics - I independent reviewed and interpreted the labs as followed: unremarkable including negative troponin and D-dimer.  Negative respiratory panel  - I independently visualized the following imaging with scope of interpretation limited to determining acute life threatening conditions related to emergency care: CXR, which revealed enlargement of cardiac silhouette but otherwise nonacute  - I personally reviewed and interpreted EKG which revealed sinus rhythm  Patient Reassessment and Ultimate Disposition/Management Workup not suggestive of CHF, COPD, ACS or PE.  It is possible she has a viral URI although respiratory panel was negative.  Ambulated with pulse ox without issue.  Advised PCP follow-up.  Discussed return precautions.  Discharged.  Chest pain also atypical and reproducible likely MSK  etiology.  Considered dissection but unlikely given symmetric pulses, reassuring vitals and workup and no widening of mediastinum on x-ray.  Patient management required discussion with the following services or consulting groups:  Hospitalist Service  Complexity of Problems Addressed Acute complicated illness or Injury  Additional Data Reviewed and Analyzed Further history obtained from: Past medical history and medications listed in the EMR, Prior ED visit notes, and Recent discharge summary  Patient Encounter Risk Assessment Consideration of hospitalization      Final diagnoses:  SOB (shortness of breath)  Atypical chest pain    ED Discharge Orders     None          Lang Norleen POUR, PA-C 01/08/24 9045  "

## 2024-01-27 ENCOUNTER — Ambulatory Visit: Payer: MEDICAID | Admitting: Dermatology

## 2024-02-01 NOTE — Progress Notes (Shared)
 " [facility] - Clinic Note  02/15/2024   CHIEF COMPLAINT Patient presents for No chief complaint on file.  HISTORY OF PRESENT ILLNESS: Monique Fowler is a 59 y.o. female who presents to the clinic today for:    Pt states she has no known history of retinal issues, nothing in the family she's aware of. Pt has a history of drug use, has been in recovery for 1.5 years. Pt admits to hx of crack use off and on starting when she was 20. Pt goes to a daily program [employer] for her recovery.   Referring physician: Rosalea Rosina SAILOR, PA [address] [place],  [place] [address]  HISTORICAL INFORMATION:  Selected notes from the MEDICAL RECORD NUMBER Referred by Rosina Rosalea, PA for diabetic eye exam / retina eval LEE:  Ocular Hx- PMH-   CURRENT MEDICATIONS: No current outpatient medications on file. (Ophthalmic Drugs)   No current facility-administered medications for this visit. (Ophthalmic Drugs)   Current Outpatient Medications (Other)  Medication Sig   albuterol  (PROVENTIL  HFA;VENTOLIN  HFA) 108 (90 Base) MCG/ACT inhaler Inhale 1-2 puffs into the lungs every 6 (six) hours as needed for wheezing or shortness of breath.   allopurinol (ZYLOPRIM) 300 MG tablet Take 300 mg by mouth daily.   ammonium lactate (LAC-HYDRIN) 12 % lotion Apply 1 application topically daily.   atorvastatin (LIPITOR) 20 MG tablet Take 20 mg by mouth daily.   azithromycin  (ZITHROMAX  Z-PAK) 250 MG tablet 2 po day one, then 1 daily x 4 days (Patient not taking: Reported on 05/25/2018)   benzonatate  (TESSALON ) 100 MG capsule Take 1 capsule (100 mg total) by mouth every 8 (eight) hours.   ciclopirox (PENLAC) 8 % solution Apply topically at bedtime.   fluticasone  (FLONASE ) 50 MCG/ACT nasal spray Place 2 sprays into both nostrils daily. (Patient not taking: Reported on 05/25/2018)   HYDROcodone -homatropine (HYCODAN) 5-1.5 MG/5ML syrup 5-10mL every 6 hours as needed for severe cough    lisinopril  (ZESTRIL ) 20 MG tablet Take 20 mg by mouth daily.   lisinopril -hydrochlorothiazide  (PRINZIDE,ZESTORETIC) 20-12.5 MG tablet Take 1 tablet by mouth daily.   meclizine  (ANTIVERT ) 25 MG tablet Take 1 tablet (25 mg total) by mouth 3 (three) times daily as needed for dizziness.   omeprazole  (PRILOSEC) 20 MG capsule Take 20 mg by mouth daily.   ondansetron  (ZOFRAN ) 4 MG tablet Take 1 tablet (4 mg total) by mouth every 6 (six) hours.   oxyCODONE -acetaminophen  (PERCOCET) 10-325 MG tablet Take 1 tablet by mouth every 4 (four) hours as needed for pain.   predniSONE  (DELTASONE ) 50 MG tablet 1 p.o. daily x 5   SYMBICORT 160-4.5 MCG/ACT inhaler Inhale 2 puffs into the lungs 2 (two) times daily.   Current Facility-Administered Medications (Other)  Medication Route   triamcinolone  acetonide (KENALOG ) 10 MG/ML injection 10 mg Other   REVIEW OF SYSTEMS:  ALLERGIES Allergies  Allergen Reactions   Tramadol  Nausea And Vomiting and Other (See Comments)   Acetaminophen  Nausea Only and Other (See Comments)   Aspirin Nausea Only   PAST MEDICAL HISTORY Past Medical History:  Diagnosis Date   Arthritis    right knee   Asthma    prn inhaler   Bipolar affective (HCC)    Breast mass, left 04/2016   Depression    GERD (gastroesophageal reflux disease)    no current med.   Hypertension    states BP sometimes under control with med., has been on med. x 3  yr.   Wears partial dentures    upper   Past Surgical History:  Procedure Laterality Date   BREAST BIOPSY Left 01/16/2016   BREAST EXCISIONAL BIOPSY Left    CESAREAN SECTION     KNEE ARTHROSCOPY Right    KNEE SURGERY     FAMILY HISTORY Family History  Problem Relation Age of Onset   Breast cancer Cousin    SOCIAL HISTORY Social History   Tobacco Use   Smoking status: Every Day    Current packs/day: 0.00    Types: Cigarettes   Smokeless tobacco: Never   Tobacco comments:    5 cig./day  Substance Use Topics   Alcohol use: No    Drug use: No       OPHTHALMIC EXAM:  Not recorded    IMAGING AND PROCEDURES  Imaging and Procedures for 02/15/2024         ASSESSMENT/PLAN: No diagnosis found.  1,2. 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  - OCT shows Diffuse retinal atrophy and ellipsoid loss OU - FA show severely delayed filling time and diffuse staining in areas of retinal atrophy -- suggestive of ischemia - discussed findings, prognosis - Pt reports history of crack cocaine usage, on and off from the time she was 59 y.o. Pt has now been in recovery 1.5 years.  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 Differential includes retinal dystrophy, inflammatory etiology or other - No intervention recommended at this time, will monitor. - F/u 6 months, DFE, OCT.  3. 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   - The need for close monitoring of blood glucose, blood pressure, and serum lipids, avoiding cigarette or any type of tobacco, and the need for long term follow up was also discussed with patient. - f/u in 1 year, sooner prn   4,5. Hypertensive retinopathy OU - discussed importance of tight BP control - monitor  6. Mixed Cataract OU - The symptoms of cataract, surgical options, and treatments and risks were discussed with patient. - discussed diagnosis and progression - monitor  Ophthalmic Meds Ordered this visit:  No orders of the defined types were placed in this encounter.    No follow-ups on file.  There are no Patient Instructions on file for this visit.  Explained the diagnoses, plan, and follow up with the patient and they expressed understanding.  Patient expressed understanding of the importance of proper follow up  care.   This document serves as a record of services personally performed by Redell JUDITHANN Hans, MD, PhD. It was created on their behalf by Wanda GEANNIE Keens, COT an ophthalmic technician. The creation of this record is the provider's dictation and/or activities during the visit.    Electronically signed by:  Wanda GEANNIE Keens, COT  02/01/24 7:26 AM   Redell JUDITHANN Hans, M.D., Ph.D. Diseases & Surgery of the Retina and Vitreous [facility] 02/15/2024    Abbreviations: M myopia (nearsighted); A astigmatism; H hyperopia (farsighted); P presbyopia; Mrx spectacle prescription;  CTL contact lenses; OD right eye; OS left eye; OU both eyes  XT exotropia; ET esotropia; PEK punctate epithelial keratitis; PEE punctate epithelial erosions; DES dry eye syndrome; MGD meibomian gland dysfunction; ATs artificial tears; PFAT's preservative free artificial tears;  NSC nuclear sclerotic cataract; PSC posterior subcapsular cataract; ERM epi-retinal membrane; PVD posterior vitreous detachment; RD retinal detachment; DM diabetes mellitus; DR diabetic retinopathy; NPDR non-proliferative diabetic retinopathy; PDR proliferative diabetic retinopathy; CSME clinically significant macular edema; DME diabetic macular edema; dbh dot blot hemorrhages; CWS cotton wool spot; POAG primary open angle glaucoma; C/D cup-to-disc ratio; HVF humphrey visual field; GVF goldmann visual field; OCT optical coherence tomography; IOP intraocular pressure; BRVO Branch retinal vein occlusion; CRVO central retinal vein occlusion; CRAO central retinal artery occlusion; BRAO branch retinal artery occlusion; RT retinal tear; SB scleral buckle; PPV pars plana vitrectomy; VH Vitreous hemorrhage; PRP panretinal laser photocoagulation; IVK intravitreal kenalog ; VMT vitreomacular traction; MH Macular hole;  NVD neovascularization of the disc; NVE neovascularization elsewhere; AREDS age related eye disease study; ARMD age related macular  degeneration; POAG primary open angle glaucoma; EBMD epithelial/anterior basement membrane dystrophy; ACIOL anterior chamber intraocular lens; IOL intraocular lens; PCIOL posterior chamber intraocular lens; Phaco/IOL phacoemulsification with intraocular lens placement; PRK photorefractive keratectomy; LASIK laser assisted in situ keratomileusis; HTN hypertension; DM diabetes mellitus; COPD chronic obstructive pulmonary disease  "

## 2024-02-07 ENCOUNTER — Ambulatory Visit: Payer: MEDICAID | Admitting: Cardiology

## 2024-02-09 DIAGNOSIS — I2510 Atherosclerotic heart disease of native coronary artery without angina pectoris: Secondary | ICD-10-CM | POA: Insufficient documentation

## 2024-02-09 DIAGNOSIS — I4719 Other supraventricular tachycardia: Secondary | ICD-10-CM | POA: Insufficient documentation

## 2024-02-09 DIAGNOSIS — I10 Essential (primary) hypertension: Secondary | ICD-10-CM | POA: Insufficient documentation

## 2024-02-09 DIAGNOSIS — I25119 Atherosclerotic heart disease of native coronary artery with unspecified angina pectoris: Secondary | ICD-10-CM

## 2024-02-09 DIAGNOSIS — I700 Atherosclerosis of aorta: Secondary | ICD-10-CM | POA: Insufficient documentation

## 2024-02-09 DIAGNOSIS — M51369 Other intervertebral disc degeneration, lumbar region without mention of lumbar back pain or lower extremity pain: Secondary | ICD-10-CM | POA: Insufficient documentation

## 2024-02-09 DIAGNOSIS — J439 Emphysema, unspecified: Secondary | ICD-10-CM

## 2024-02-09 DIAGNOSIS — L309 Dermatitis, unspecified: Secondary | ICD-10-CM | POA: Insufficient documentation

## 2024-02-09 DIAGNOSIS — J449 Chronic obstructive pulmonary disease, unspecified: Secondary | ICD-10-CM | POA: Insufficient documentation

## 2024-02-09 NOTE — Progress Notes (Unsigned)
" °  Cardiology Office Note:  .   Date:  02/09/2024  ID:  Monique Fowler, DOB [birth year], MRN [id number] PCP: Rosalea Rosina SAILOR, PA  [facility] Providers Cardiologist:  Newman Lawrence, MD PCP: Rosalea Rosina SAILOR, PA  No chief complaint on file.    Monique Fowler is a 59 y.o. female with *** Discussed the use of AI scribe software for clinical note transcription with the patient, who gave verbal consent to proceed.  History of Present Illness       There were no vitals filed for this visit.    ROS      Studies Reviewed: .        *** Labs 12/2023: Hb 14.7 Cr 0.63 ProBNP 439  2017: Chol 255, TG 190, HDL 48, LDL 169  Risk Assessment/Calculations:   {Does this patient have ATRIAL FIBRILLATION?:[phone]}    Physical Exam   VISIT DIAGNOSES: No diagnosis found.   Monique Fowler is a 59 y.o. female with *** Assessment and Plan Assessment & Plan       {Are you ordering a CV Procedure (e.g. stress test, cath, DCCV, TEE, etc)?   Press F2        :[id number]}    No orders of the defined types were placed in this encounter.    F/u in ***  Signed, Newman JINNY Lawrence, MD  "

## 2024-02-10 ENCOUNTER — Other Ambulatory Visit (HOSPITAL_COMMUNITY): Payer: Self-pay

## 2024-02-10 ENCOUNTER — Ambulatory Visit: Payer: MEDICAID | Admitting: Cardiology

## 2024-02-10 ENCOUNTER — Encounter: Payer: Self-pay | Admitting: Cardiology

## 2024-02-10 VITALS — BP 102/70 | HR 87 | Ht 67.0 in | Wt 200.4 lb

## 2024-02-10 DIAGNOSIS — F17210 Nicotine dependence, cigarettes, uncomplicated: Secondary | ICD-10-CM | POA: Insufficient documentation

## 2024-02-10 DIAGNOSIS — R072 Precordial pain: Secondary | ICD-10-CM | POA: Insufficient documentation

## 2024-02-10 DIAGNOSIS — I10 Essential (primary) hypertension: Secondary | ICD-10-CM | POA: Diagnosis present

## 2024-02-10 DIAGNOSIS — E782 Mixed hyperlipidemia: Secondary | ICD-10-CM | POA: Diagnosis present

## 2024-02-10 MED ORDER — IVABRADINE HCL 7.5 MG PO TABS
15.0000 mg | ORAL_TABLET | Freq: Once | ORAL | 0 refills | Status: AC
  Filled 2024-02-10: qty 2, 1d supply, fill #0

## 2024-02-10 NOTE — Patient Instructions (Addendum)
 Medication Instructions:  ON DAY OF CT SCAN: ivabradine  (CORLANOR) 7.5 MG TABS tablet         Take 2 tablets (15 mg total) by mouth once for 1 dose. Take 90-120 minutes prior to scan.   *If you need a refill on your cardiac medications before your next appointment, please call your pharmacy*  Testing/Procedures: ECHOCARDIOGRAM  Your physician has requested that you have an echocardiogram. Echocardiography is a painless test that uses sound waves to create images of your heart. It provides your doctor with information about the size and shape of your heart and how well your hearts chambers and valves are working. This procedure takes approximately one hour. There are no restrictions for this procedure. Please do NOT wear cologne, perfume, aftershave, or lotions (deodorant is allowed). Please arrive 15 minutes prior to your appointment time.  Please note: We ask at that you not bring children with you during ultrasound (echo/ vascular) testing. Due to room size and safety concerns, children are not allowed in the ultrasound rooms during exams. Our front office staff cannot provide observation of children in our lobby area while testing is being conducted. An adult accompanying a patient to their appointment will only be allowed in the ultrasound room at the discretion of the ultrasound technician under special circumstances. We apologize for any inconvenience.  CORONARY CTA     Your cardiac CT will be scheduled at one of the below locations:   [facility] [address]  [place], [place] [address]  If scheduled at the [facility] at [address], please enter the parking lot using the [address] entrance and use the FREE valet service at the patient drop-off area. Enter the building and check-in with registration on the main floor.   Please follow these instructions carefully (unless otherwise directed):  An IV will be required for this  test and Nitroglycerin will be given.  Hold all erectile dysfunction medications at least 3 days (72 hrs) prior to test. (Ie viagra, cialis, sildenafil, tadalafil, etc)   On the Night Before the Test: Be sure to Drink plenty of water. Do not consume any caffeinated/decaffeinated beverages or chocolate 12 hours prior to your test. Do not take any antihistamines 12 hours prior to your test.  On the Day of the Test: Drink plenty of water until 1 hour prior to the test. Do not eat any food 1 hour prior to test. You may take your regular medications prior to the test.  Take IVABRADINE  two hours prior to test. If you take Furosemide/Hydrochlorothiazide /Spironolactone/Chlorthalidone, please HOLD on the morning of the test. Patients who wear a continuous glucose monitor MUST remove the device prior to scanning. FEMALES- please wear underwire-free bra if available, avoid dresses & tight clothing      After the Test: Drink plenty of water. After receiving IV contrast, you may experience a mild flushed feeling. This is normal. On occasion, you may experience a mild rash up to 24 hours after the test. This is not dangerous. If this occurs, you can take Benadryl  25 mg, Zyrtec , Claritin, or Allegra and increase your fluid intake. (Patients taking Tikosyn should avoid Benadryl , and may take Zyrtec , Claritin, or Allegra) If you experience trouble breathing, this can be serious. If it is severe call 911 IMMEDIATELY. If it is mild, please call our office.  We will call to schedule your test 2-4 weeks out understanding that some insurance companies will need an authorization prior to  the service being performed.   For more information and frequently asked questions, please visit our website : [email or web address]  For non-scheduling related questions, please contact the cardiac imaging nurse navigator should you have any questions/concerns: Cardiac Imaging Nurse Navigators Direct Office Dial:  [phone]   For scheduling needs, including cancellations and rescheduling, please call Brittany, [phone].  For billing questions, please call [phone].    Follow-Up: At [facility], you and your health needs are our priority.  As part of our continuing mission to provide you with exceptional heart care, our providers are all part of one team.  This team includes your primary Cardiologist (physician) and Advanced Practice Providers or APPs (Physician Assistants and Nurse Practitioners) who all work together to provide you with the care you need, when you need it.  Your next appointment:   AS NEEDED   Provider:   Dr. Elmira   We recommend signing up for the patient portal called MyChart.  Sign up information is provided on this After Visit Summary.  MyChart is used to connect with patients for Virtual Visits (Telemedicine).  Patients are able to view lab/test results, encounter notes, upcoming appointments, etc.  Non-urgent messages can be sent to your provider as well.   To learn more about what you can do with MyChart, go to [email or web address].

## 2024-02-14 ENCOUNTER — Encounter: Payer: Self-pay | Admitting: Gastroenterology

## 2024-02-15 ENCOUNTER — Encounter (INDEPENDENT_AMBULATORY_CARE_PROVIDER_SITE_OTHER): Payer: Self-pay

## 2024-02-15 ENCOUNTER — Encounter (INDEPENDENT_AMBULATORY_CARE_PROVIDER_SITE_OTHER): Payer: MEDICAID | Admitting: Ophthalmology

## 2024-02-15 DIAGNOSIS — H35033 Hypertensive retinopathy, bilateral: Secondary | ICD-10-CM

## 2024-02-15 DIAGNOSIS — H3589 Other specified retinal disorders: Secondary | ICD-10-CM

## 2024-02-15 DIAGNOSIS — H25813 Combined forms of age-related cataract, bilateral: Secondary | ICD-10-CM

## 2024-02-15 DIAGNOSIS — E119 Type 2 diabetes mellitus without complications: Secondary | ICD-10-CM

## 2024-02-15 DIAGNOSIS — I10 Essential (primary) hypertension: Secondary | ICD-10-CM

## 2024-02-15 DIAGNOSIS — H3582 Retinal ischemia: Secondary | ICD-10-CM

## 2024-02-28 ENCOUNTER — Ambulatory Visit (HOSPITAL_COMMUNITY): Payer: MEDICAID

## 2024-03-08 ENCOUNTER — Ambulatory Visit (HOSPITAL_COMMUNITY): Payer: MEDICAID
